# Patient Record
Sex: Female | Born: 1995
Health system: Southern US, Community
[De-identification: ages and names within clinical notes are randomized; demographics above are authoritative.]

## PROBLEM LIST (undated history)

## (undated) DIAGNOSIS — Z789 Other specified health status: Secondary | ICD-10-CM

---

## 2019-01-24 DIAGNOSIS — R569 Unspecified convulsions: Secondary | ICD-10-CM

## 2019-01-24 HISTORY — DX: Unspecified convulsions: R56.9

## 2019-01-29 ENCOUNTER — Emergency Department (HOSPITAL_COMMUNITY): Payer: No Typology Code available for payment source

## 2019-01-29 ENCOUNTER — Encounter (HOSPITAL_COMMUNITY): Payer: Self-pay | Admitting: Emergency Medicine

## 2019-01-29 ENCOUNTER — Inpatient Hospital Stay (HOSPITAL_COMMUNITY): Payer: No Typology Code available for payment source | Admitting: Certified Registered Nurse Anesthetist

## 2019-01-29 ENCOUNTER — Other Ambulatory Visit: Payer: Self-pay

## 2019-01-29 ENCOUNTER — Inpatient Hospital Stay (HOSPITAL_COMMUNITY)
Admission: EM | Admit: 2019-01-29 | Discharge: 2019-02-10 | DRG: 955 | Disposition: A | Discharge status: Rehabilitation Facility | Payer: No Typology Code available for payment source | Attending: Student | Admitting: Student

## 2019-01-29 ENCOUNTER — Inpatient Hospital Stay (HOSPITAL_COMMUNITY): Payer: No Typology Code available for payment source

## 2019-01-29 ENCOUNTER — Encounter (HOSPITAL_COMMUNITY): Admission: EM | Disposition: A | Discharge status: Rehabilitation Facility | Payer: Self-pay | Source: Home / Self Care

## 2019-01-29 DIAGNOSIS — Z4659 Encounter for fitting and adjustment of other gastrointestinal appliance and device: Secondary | ICD-10-CM

## 2019-01-29 DIAGNOSIS — S42033B Displaced fracture of lateral end of unspecified clavicle, initial encounter for open fracture: Secondary | ICD-10-CM

## 2019-01-29 DIAGNOSIS — J9601 Acute respiratory failure with hypoxia: Secondary | ICD-10-CM | POA: Diagnosis present

## 2019-01-29 DIAGNOSIS — S72461C Displaced supracondylar fracture with intracondylar extension of lower end of right femur, initial encounter for open fracture type IIIA, IIIB, or IIIC: Secondary | ICD-10-CM | POA: Diagnosis present

## 2019-01-29 DIAGNOSIS — G40401 Other generalized epilepsy and epileptic syndromes, not intractable, with status epilepticus: Secondary | ICD-10-CM | POA: Diagnosis present

## 2019-01-29 DIAGNOSIS — S81811A Laceration without foreign body, right lower leg, initial encounter: Secondary | ICD-10-CM

## 2019-01-29 DIAGNOSIS — T4275XA Adverse effect of unspecified antiepileptic and sedative-hypnotic drugs, initial encounter: Secondary | ICD-10-CM | POA: Diagnosis not present

## 2019-01-29 DIAGNOSIS — G92 Toxic encephalopathy: Secondary | ICD-10-CM | POA: Diagnosis not present

## 2019-01-29 DIAGNOSIS — S42034B Nondisplaced fracture of lateral end of right clavicle, initial encounter for open fracture: Secondary | ICD-10-CM | POA: Diagnosis present

## 2019-01-29 DIAGNOSIS — S76121A Laceration of right quadriceps muscle, fascia and tendon, initial encounter: Secondary | ICD-10-CM | POA: Diagnosis present

## 2019-01-29 DIAGNOSIS — S0101XA Laceration without foreign body of scalp, initial encounter: Secondary | ICD-10-CM | POA: Diagnosis present

## 2019-01-29 DIAGNOSIS — G936 Cerebral edema: Secondary | ICD-10-CM | POA: Diagnosis present

## 2019-01-29 DIAGNOSIS — S42031B Displaced fracture of lateral end of right clavicle, initial encounter for open fracture: Secondary | ICD-10-CM | POA: Diagnosis present

## 2019-01-29 DIAGNOSIS — E222 Syndrome of inappropriate secretion of antidiuretic hormone: Secondary | ICD-10-CM | POA: Diagnosis not present

## 2019-01-29 DIAGNOSIS — D62 Acute posthemorrhagic anemia: Secondary | ICD-10-CM | POA: Diagnosis not present

## 2019-01-29 DIAGNOSIS — S36031A Moderate laceration of spleen, initial encounter: Secondary | ICD-10-CM | POA: Diagnosis present

## 2019-01-29 DIAGNOSIS — S065X9A Traumatic subdural hemorrhage with loss of consciousness of unspecified duration, initial encounter: Secondary | ICD-10-CM | POA: Diagnosis not present

## 2019-01-29 DIAGNOSIS — S2231XA Fracture of one rib, right side, initial encounter for closed fracture: Secondary | ICD-10-CM | POA: Diagnosis present

## 2019-01-29 DIAGNOSIS — Y92410 Unspecified street and highway as the place of occurrence of the external cause: Secondary | ICD-10-CM | POA: Diagnosis not present

## 2019-01-29 DIAGNOSIS — S82001A Unspecified fracture of right patella, initial encounter for closed fracture: Secondary | ICD-10-CM | POA: Diagnosis present

## 2019-01-29 DIAGNOSIS — S82142A Displaced bicondylar fracture of left tibia, initial encounter for closed fracture: Secondary | ICD-10-CM

## 2019-01-29 DIAGNOSIS — S42036A Nondisplaced fracture of lateral end of unspecified clavicle, initial encounter for closed fracture: Secondary | ICD-10-CM

## 2019-01-29 DIAGNOSIS — S0181XA Laceration without foreign body of other part of head, initial encounter: Secondary | ICD-10-CM | POA: Diagnosis present

## 2019-01-29 DIAGNOSIS — S065X9D Traumatic subdural hemorrhage with loss of consciousness of unspecified duration, subsequent encounter: Secondary | ICD-10-CM | POA: Diagnosis not present

## 2019-01-29 DIAGNOSIS — S065XAA Traumatic subdural hemorrhage with loss of consciousness status unknown, initial encounter: Secondary | ICD-10-CM

## 2019-01-29 DIAGNOSIS — S61411A Laceration without foreign body of right hand, initial encounter: Secondary | ICD-10-CM | POA: Diagnosis present

## 2019-01-29 DIAGNOSIS — Z20822 Contact with and (suspected) exposure to covid-19: Secondary | ICD-10-CM | POA: Diagnosis present

## 2019-01-29 DIAGNOSIS — S82044A Nondisplaced comminuted fracture of right patella, initial encounter for closed fracture: Secondary | ICD-10-CM

## 2019-01-29 DIAGNOSIS — S76111A Strain of right quadriceps muscle, fascia and tendon, initial encounter: Secondary | ICD-10-CM

## 2019-01-29 DIAGNOSIS — Z419 Encounter for procedure for purposes other than remedying health state, unspecified: Secondary | ICD-10-CM

## 2019-01-29 DIAGNOSIS — T1490XA Injury, unspecified, initial encounter: Secondary | ICD-10-CM

## 2019-01-29 DIAGNOSIS — J9690 Respiratory failure, unspecified, unspecified whether with hypoxia or hypercapnia: Secondary | ICD-10-CM

## 2019-01-29 DIAGNOSIS — S42292A Other displaced fracture of upper end of left humerus, initial encounter for closed fracture: Secondary | ICD-10-CM | POA: Diagnosis present

## 2019-01-29 DIAGNOSIS — R40241 Glasgow coma scale score 13-15, unspecified time: Secondary | ICD-10-CM | POA: Diagnosis present

## 2019-01-29 DIAGNOSIS — E861 Hypovolemia: Secondary | ICD-10-CM | POA: Diagnosis present

## 2019-01-29 DIAGNOSIS — Z978 Presence of other specified devices: Secondary | ICD-10-CM

## 2019-01-29 DIAGNOSIS — S20212A Contusion of left front wall of thorax, initial encounter: Secondary | ICD-10-CM | POA: Diagnosis present

## 2019-01-29 DIAGNOSIS — S72463C Displaced supracondylar fracture with intracondylar extension of lower end of unspecified femur, initial encounter for open fracture type IIIA, IIIB, or IIIC: Secondary | ICD-10-CM | POA: Insufficient documentation

## 2019-01-29 DIAGNOSIS — Z9289 Personal history of other medical treatment: Secondary | ICD-10-CM

## 2019-01-29 DIAGNOSIS — T148XXA Other injury of unspecified body region, initial encounter: Secondary | ICD-10-CM

## 2019-01-29 DIAGNOSIS — E876 Hypokalemia: Secondary | ICD-10-CM | POA: Diagnosis present

## 2019-01-29 DIAGNOSIS — S36039A Unspecified laceration of spleen, initial encounter: Secondary | ICD-10-CM

## 2019-01-29 DIAGNOSIS — S42202A Unspecified fracture of upper end of left humerus, initial encounter for closed fracture: Secondary | ICD-10-CM

## 2019-01-29 DIAGNOSIS — S7291XC Unspecified fracture of right femur, initial encounter for open fracture type IIIA, IIIB, or IIIC: Secondary | ICD-10-CM

## 2019-01-29 HISTORY — PX: CRANIOTOMY: SHX93

## 2019-01-29 HISTORY — DX: Other specified health status: Z78.9

## 2019-01-29 LAB — COMPREHENSIVE METABOLIC PANEL
ALT: 67 U/L — ABNORMAL HIGH (ref 0–44)
AST: 371 U/L — ABNORMAL HIGH (ref 15–41)
Albumin: 2.7 g/dL — ABNORMAL LOW (ref 3.5–5.0)
Alkaline Phosphatase: 73 U/L (ref 38–126)
Anion gap: 11 (ref 5–15)
BUN: 7 mg/dL (ref 6–20)
CO2: 23 mmol/L (ref 22–32)
Calcium: 7.8 mg/dL — ABNORMAL LOW (ref 8.9–10.3)
Chloride: 103 mmol/L (ref 98–111)
Creatinine, Ser: 0.91 mg/dL (ref 0.44–1.00)
GFR calc Af Amer: >60 mL/min (ref >60)
GFR calc non Af Amer: >60 mL/min (ref >60)
Glucose, Bld: 183 mg/dL — ABNORMAL HIGH (ref 70–99)
Potassium: 3.4 mmol/L — ABNORMAL LOW (ref 3.5–5.1)
Sodium: 137 mmol/L (ref 135–145)
Total Bilirubin: 0.7 mg/dL (ref 0.3–1.2)
Total Protein: 5.9 g/dL — ABNORMAL LOW (ref 6.5–8.1)

## 2019-01-29 LAB — I-STAT CHEM 8, ED
BUN: 7 mg/dL (ref 6–20)
Calcium, Ion: 1.02 mmol/L — ABNORMAL LOW (ref 1.15–1.40)
Chloride: 101 mmol/L (ref 98–111)
Creatinine, Ser: 0.9 mg/dL (ref 0.44–1.00)
Glucose, Bld: 175 mg/dL — ABNORMAL HIGH (ref 70–99)
HCT: 32 % — ABNORMAL LOW (ref 36.0–46.0)
Hemoglobin: 10.9 g/dL — ABNORMAL LOW (ref 12.0–15.0)
Potassium: 3.2 mmol/L — ABNORMAL LOW (ref 3.5–5.1)
Sodium: 138 mmol/L (ref 135–145)
TCO2: 25 mmol/L (ref 22–32)

## 2019-01-29 LAB — PROTIME-INR
INR: 1.2 (ref 0.8–1.2)
Prothrombin Time: 15.1 s (ref 11.4–15.2)

## 2019-01-29 LAB — POCT I-STAT 7, (LYTES, BLD GAS, ICA,H+H)
Bicarbonate: 24.8 mmol/L (ref 20.0–28.0)
Calcium, Ion: 1.12 mmol/L — ABNORMAL LOW (ref 1.15–1.40)
HCT: 29 % — ABNORMAL LOW (ref 36.0–46.0)
Hemoglobin: 9.9 g/dL — ABNORMAL LOW (ref 12.0–15.0)
O2 Saturation: 100 %
Patient temperature: 96.9
Potassium: 2.9 mmol/L — ABNORMAL LOW (ref 3.5–5.1)
Sodium: 138 mmol/L (ref 135–145)
TCO2: 26 mmol/L (ref 22–32)
pCO2 arterial: 38.1 mmHg (ref 32.0–48.0)
pH, Arterial: 7.418 (ref 7.350–7.450)
pO2, Arterial: 181 mmHg — ABNORMAL HIGH (ref 83.0–108.0)

## 2019-01-29 LAB — URINALYSIS, ROUTINE W REFLEX MICROSCOPIC
Bilirubin Urine: NEGATIVE
Glucose, UA: NEGATIVE mg/dL
Ketones, ur: NEGATIVE mg/dL
Leukocytes,Ua: NEGATIVE
Nitrite: NEGATIVE
Protein, ur: 100 mg/dL — AB
Specific Gravity, Urine: 1.032 — ABNORMAL HIGH (ref 1.005–1.030)
pH: 6 (ref 5.0–8.0)

## 2019-01-29 LAB — CBC
HCT: 33.4 % — ABNORMAL LOW (ref 36.0–46.0)
Hemoglobin: 10.7 g/dL — ABNORMAL LOW (ref 12.0–15.0)
MCH: 28.2 pg (ref 26.0–34.0)
MCHC: 32 g/dL (ref 30.0–36.0)
MCV: 88.1 fL (ref 80.0–100.0)
Platelets: 342 10*3/uL (ref 150–400)
RBC: 3.79 MIL/uL — ABNORMAL LOW (ref 3.87–5.11)
RDW: 13.4 % (ref 11.5–15.5)
WBC: 20.3 10*3/uL — ABNORMAL HIGH (ref 4.0–10.5)
nRBC: 0.1 % (ref 0.0–0.2)

## 2019-01-29 LAB — RAPID URINE DRUG SCREEN, HOSP PERFORMED
Amphetamines: NOT DETECTED
Barbiturates: NOT DETECTED
Benzodiazepines: POSITIVE — AB
Cocaine: POSITIVE — AB
Opiates: POSITIVE — AB
Tetrahydrocannabinol: POSITIVE — AB

## 2019-01-29 LAB — ABO/RH: ABO/RH(D): O POS

## 2019-01-29 LAB — LACTIC ACID, PLASMA: Lactic Acid, Venous: 2.7 mmol/L (ref 0.5–1.9)

## 2019-01-29 LAB — RESPIRATORY PANEL BY RT PCR (FLU A&B, COVID)
Influenza A by PCR: NEGATIVE
Influenza B by PCR: NEGATIVE
SARS Coronavirus 2 by RT PCR: NEGATIVE

## 2019-01-29 LAB — ETHANOL: Alcohol, Ethyl (B): <10 mg/dL (ref <10)

## 2019-01-29 LAB — SAMPLE TO BLOOD BANK

## 2019-01-29 LAB — PREGNANCY, URINE: Preg Test, Ur: NEGATIVE

## 2019-01-29 SURGERY — CRANIOTOMY HEMATOMA EVACUATION SUBDURAL
Anesthesia: General | Site: Head | Laterality: Left

## 2019-01-29 MED ORDER — LIDOCAINE-EPINEPHRINE 1 %-1:100000 IJ SOLN
INTRAMUSCULAR | Status: AC
  Filled 2019-01-29: qty 1

## 2019-01-29 MED ORDER — ROCURONIUM BROMIDE 100 MG/10ML IV SOLN
INTRAVENOUS | Status: DC | PRN
  Administered 2019-01-29: 100 mg via INTRAVENOUS
  Administered 2019-01-29 (×2): 50 mg via INTRAVENOUS

## 2019-01-29 MED ORDER — PROPOFOL 1000 MG/100ML IV EMUL
5.0000 ug/kg/min | INTRAVENOUS | Status: DC
  Administered 2019-01-29: 5 ug/kg/min via INTRAVENOUS

## 2019-01-29 MED ORDER — POTASSIUM CHLORIDE 10 MEQ/100ML IV SOLN
10.0000 meq | INTRAVENOUS | Status: AC
  Administered 2019-01-30: 10 meq via INTRAVENOUS
  Filled 2019-01-29: qty 100

## 2019-01-29 MED ORDER — MANNITOL 25 % IV SOLN
25.0000 g | Freq: Once | Status: AC
  Administered 2019-01-29: 25 g via INTRAVENOUS
  Filled 2019-01-29: qty 100

## 2019-01-29 MED ORDER — ONDANSETRON HCL 4 MG/2ML IJ SOLN
4.0000 mg | Freq: Four times a day (QID) | INTRAMUSCULAR | Status: DC | PRN
  Administered 2019-02-03: 4 mg via INTRAVENOUS

## 2019-01-29 MED ORDER — LIDOCAINE-EPINEPHRINE 1 %-1:100000 IJ SOLN
INTRAMUSCULAR | Status: DC | PRN
  Administered 2019-01-29: 30 mL

## 2019-01-29 MED ORDER — THROMBIN 20000 UNITS EX SOLR
CUTANEOUS | Status: AC
  Filled 2019-01-29: qty 20000

## 2019-01-29 MED ORDER — CEFAZOLIN SODIUM-DEXTROSE 2-3 GM-%(50ML) IV SOLR
INTRAVENOUS | Status: DC | PRN
  Administered 2019-01-29: 2 g via INTRAVENOUS

## 2019-01-29 MED ORDER — PHENYLEPHRINE HCL (PRESSORS) 10 MG/ML IV SOLN
INTRAVENOUS | Status: DC | PRN
  Administered 2019-01-29: 80 ug via INTRAVENOUS

## 2019-01-29 MED ORDER — SODIUM CHLORIDE 0.9 % IV SOLN: INTRAVENOUS | Status: DC | PRN

## 2019-01-29 MED ORDER — PROPOFOL 10 MG/ML IV BOLUS
INTRAVENOUS | Status: AC
  Filled 2019-01-29: qty 40

## 2019-01-29 MED ORDER — 0.9 % SODIUM CHLORIDE (POUR BTL) OPTIME
TOPICAL | Status: DC | PRN
  Administered 2019-01-29 (×4): 1000 mL

## 2019-01-29 MED ORDER — CHLORHEXIDINE GLUCONATE 0.12% ORAL RINSE (MEDLINE KIT)
15.0000 mL | Freq: Two times a day (BID) | OROMUCOSAL | Status: DC
  Administered 2019-01-30 – 2019-02-01 (×6): 15 mL via OROMUCOSAL

## 2019-01-29 MED ORDER — PROPOFOL 1000 MG/100ML IV EMUL
0.0000 ug/kg/min | INTRAVENOUS | Status: DC
  Administered 2019-01-30 (×3): 40 ug/kg/min via INTRAVENOUS
  Administered 2019-01-30: 45 ug/kg/min via INTRAVENOUS
  Administered 2019-01-31: 40 ug/kg/min via INTRAVENOUS
  Administered 2019-01-31: 07:00:00 50 ug/kg/min via INTRAVENOUS
  Filled 2019-01-29 (×5): qty 100

## 2019-01-29 MED ORDER — FENTANYL CITRATE (PF) 250 MCG/5ML IJ SOLN
INTRAMUSCULAR | Status: DC | PRN
  Administered 2019-01-29 (×3): 50 ug via INTRAVENOUS
  Administered 2019-01-29: 100 ug via INTRAVENOUS

## 2019-01-29 MED ORDER — THROMBIN 5000 UNITS EX SOLR
CUTANEOUS | Status: AC
  Filled 2019-01-29: qty 5000

## 2019-01-29 MED ORDER — ALBUMIN HUMAN 5 % IV SOLN: INTRAVENOUS | Status: DC | PRN

## 2019-01-29 MED ORDER — HEMOSTATIC AGENTS (NO CHARGE) OPTIME
TOPICAL | Status: DC | PRN
  Administered 2019-01-29: 1 via TOPICAL

## 2019-01-29 MED ORDER — CEFAZOLIN SODIUM-DEXTROSE 2-4 GM/100ML-% IV SOLN
2.0000 g | Freq: Once | INTRAVENOUS | Status: AC
  Administered 2019-01-29: 2 g via INTRAVENOUS

## 2019-01-29 MED ORDER — FENTANYL CITRATE (PF) 250 MCG/5ML IJ SOLN
INTRAMUSCULAR | Status: AC
  Filled 2019-01-29: qty 5

## 2019-01-29 MED ORDER — ROCURONIUM BROMIDE 10 MG/ML (PF) SYRINGE
PREFILLED_SYRINGE | INTRAVENOUS | Status: AC
  Filled 2019-01-29: qty 10

## 2019-01-29 MED ORDER — ONDANSETRON 4 MG PO TBDP: 4.0000 mg | ORAL_TABLET | Freq: Four times a day (QID) | ORAL | Status: DC | PRN

## 2019-01-29 MED ORDER — ROCURONIUM BROMIDE 10 MG/ML (PF) SYRINGE
PREFILLED_SYRINGE | INTRAVENOUS | Status: AC
  Filled 2019-01-29: qty 20

## 2019-01-29 MED ORDER — SODIUM CHLORIDE 0.9 % IV SOLN: INTRAVENOUS | Status: DC

## 2019-01-29 MED ORDER — PROPOFOL 1000 MG/100ML IV EMUL
INTRAVENOUS | Status: AC
  Filled 2019-01-29: qty 100

## 2019-01-29 MED ORDER — FENTANYL CITRATE (PF) 100 MCG/2ML IJ SOLN
50.0000 ug | INTRAMUSCULAR | Status: DC | PRN
  Administered 2019-01-30: 09:00:00 50 ug via INTRAVENOUS
  Filled 2019-01-29: qty 2

## 2019-01-29 MED ORDER — LEVETIRACETAM IN NACL 1000 MG/100ML IV SOLN
1000.0000 mg | Freq: Two times a day (BID) | INTRAVENOUS | Status: DC
  Administered 2019-01-30 – 2019-02-02 (×8): 1000 mg via INTRAVENOUS
  Filled 2019-01-29 (×8): qty 100

## 2019-01-29 MED ORDER — LIDOCAINE 2% (20 MG/ML) 5 ML SYRINGE
INTRAMUSCULAR | Status: AC
  Filled 2019-01-29: qty 5

## 2019-01-29 MED ORDER — BISACODYL 10 MG RE SUPP: 10.0000 mg | Freq: Every day | RECTAL | Status: DC | PRN

## 2019-01-29 MED ORDER — FENTANYL CITRATE (PF) 100 MCG/2ML IJ SOLN
50.0000 ug | INTRAMUSCULAR | Status: DC | PRN
  Administered 2019-01-30 – 2019-01-31 (×6): 100 ug via INTRAVENOUS
  Administered 2019-01-31 (×2): 200 ug via INTRAVENOUS
  Administered 2019-01-31: 100 ug via INTRAVENOUS
  Filled 2019-01-29 (×2): qty 2
  Filled 2019-01-29: qty 4
  Filled 2019-01-29: qty 2
  Filled 2019-01-29: qty 4
  Filled 2019-01-29 (×4): qty 2

## 2019-01-29 MED ORDER — ETOMIDATE 2 MG/ML IV SOLN
INTRAVENOUS | Status: AC | PRN
  Administered 2019-01-29: 20 mg via INTRAVENOUS

## 2019-01-29 MED ORDER — IOHEXOL 300 MG/ML  SOLN
100.0000 mL | Freq: Once | INTRAMUSCULAR | Status: AC | PRN
  Administered 2019-01-29: 19:00:00 100 mL via INTRAVENOUS

## 2019-01-29 MED ORDER — THROMBIN 5000 UNITS EX SOLR: OROMUCOSAL | Status: DC | PRN

## 2019-01-29 MED ORDER — ORAL CARE MOUTH RINSE
15.0000 mL | OROMUCOSAL | Status: DC
  Administered 2019-01-30 – 2019-02-01 (×22): 15 mL via OROMUCOSAL

## 2019-01-29 MED ORDER — CEFAZOLIN SODIUM 1 G IJ SOLR
INTRAMUSCULAR | Status: AC
  Filled 2019-01-29: qty 20

## 2019-01-29 MED ORDER — PHENYLEPHRINE 40 MCG/ML (10ML) SYRINGE FOR IV PUSH (FOR BLOOD PRESSURE SUPPORT)
PREFILLED_SYRINGE | INTRAVENOUS | Status: AC
  Filled 2019-01-29: qty 20

## 2019-01-29 MED ORDER — LEVETIRACETAM IN NACL 1000 MG/100ML IV SOLN
1000.0000 mg | Freq: Once | INTRAVENOUS | Status: AC
  Administered 2019-01-29: 1000 mg via INTRAVENOUS
  Filled 2019-01-29: qty 100

## 2019-01-29 MED ORDER — BACITRACIN ZINC 500 UNIT/GM EX OINT
TOPICAL_OINTMENT | CUTANEOUS | Status: AC
  Filled 2019-01-29: qty 28.35

## 2019-01-29 MED ORDER — SODIUM CHLORIDE 0.9 % IV SOLN
2.0000 g | INTRAVENOUS | Status: DC
  Administered 2019-01-30: 2 g via INTRAVENOUS
  Filled 2019-01-29: qty 2

## 2019-01-29 MED ORDER — ROCURONIUM BROMIDE 50 MG/5ML IV SOLN
INTRAVENOUS | Status: AC | PRN
  Administered 2019-01-29: 70 mg via INTRAVENOUS

## 2019-01-29 MED ORDER — THROMBIN 20000 UNITS EX SOLR: CUTANEOUS | Status: DC | PRN

## 2019-01-29 SURGICAL SUPPLY — 66 items
BLADE CLIPPER SURG (BLADE) ×2 IMPLANT
BUR ACORN 6.0 PRECISION (BURR) ×2 IMPLANT
BUR MATCHSTICK NEURO 3.0 LAGG (BURR) ×1 IMPLANT
BUR SPIRAL ROUTER 2.3 (BUR) ×1 IMPLANT
CANISTER SUCT 3000ML PPV (MISCELLANEOUS) ×2 IMPLANT
CARTRIDGE OIL MAESTRO DRILL (MISCELLANEOUS) ×1 IMPLANT
CLIP VESOCCLUDE MED 6/CT (CLIP) IMPLANT
DIFFUSER DRILL AIR PNEUMATIC (MISCELLANEOUS) ×2 IMPLANT
DRAPE NEUROLOGICAL W/INCISE (DRAPES) ×2 IMPLANT
DRAPE SURG 17X23 STRL (DRAPES) IMPLANT
DRAPE WARM FLUID 44X44 (DRAPES) ×2 IMPLANT
DRSG MEPILEX BORDER 4X8 (GAUZE/BANDAGES/DRESSINGS) ×2 IMPLANT
DRSG TEGADERM 2-3/8X2-3/4 SM (GAUZE/BANDAGES/DRESSINGS) ×1 IMPLANT
DURAGUARD 06CMX08CM ×2 IMPLANT
DURAPREP 6ML APPLICATOR 50/CS (WOUND CARE) ×2 IMPLANT
ELECT REM PT RETURN 9FT ADLT (ELECTROSURGICAL) ×2
ELECTRODE REM PT RTRN 9FT ADLT (ELECTROSURGICAL) ×1 IMPLANT
EVACUATOR 1/8 PVC DRAIN (DRAIN) IMPLANT
GAUZE 4X4 16PLY RFD (DISPOSABLE) IMPLANT
GAUZE SPONGE 4X4 12PLY STRL (GAUZE/BANDAGES/DRESSINGS) ×2 IMPLANT
GLOVE BIOGEL PI IND STRL 7.5 (GLOVE) ×2 IMPLANT
GLOVE BIOGEL PI INDICATOR 7.5 (GLOVE) ×2
GLOVE ECLIPSE 7.5 STRL STRAW (GLOVE) ×4 IMPLANT
GOWN STRL REUS W/ TWL LRG LVL3 (GOWN DISPOSABLE) ×2 IMPLANT
GOWN STRL REUS W/ TWL XL LVL3 (GOWN DISPOSABLE) IMPLANT
GOWN STRL REUS W/TWL 2XL LVL3 (GOWN DISPOSABLE) IMPLANT
GOWN STRL REUS W/TWL LRG LVL3 (GOWN DISPOSABLE) ×2
GOWN STRL REUS W/TWL XL LVL3 (GOWN DISPOSABLE) ×2
GRAFT DURAGEN MATRIX 5WX7L (Graft) ×1 IMPLANT
HEMOSTAT POWDER KIT SURGIFOAM (HEMOSTASIS) ×2 IMPLANT
HEMOSTAT SURGICEL 2X14 (HEMOSTASIS) ×1 IMPLANT
KIT BASIN OR (CUSTOM PROCEDURE TRAY) ×2 IMPLANT
KIT TURNOVER KIT B (KITS) ×2 IMPLANT
NEEDLE HYPO 22GX1.5 SAFETY (NEEDLE) ×2 IMPLANT
NS IRRIG 1000ML POUR BTL (IV SOLUTION) ×2 IMPLANT
OIL CARTRIDGE MAESTRO DRILL (MISCELLANEOUS) ×2
PACK CRANIOTOMY CUSTOM (CUSTOM PROCEDURE TRAY) ×2 IMPLANT
PATTIES SURGICAL .5 X.5 (GAUZE/BANDAGES/DRESSINGS) IMPLANT
PATTIES SURGICAL .5 X3 (DISPOSABLE) IMPLANT
PATTIES SURGICAL 1X1 (DISPOSABLE) IMPLANT
PERFORATOR LRG  14-11MM (BIT) ×1
PERFORATOR LRG 14-11MM (BIT) IMPLANT
PLATE 1.5  2HOLE LNG NEURO (Plate) ×1 IMPLANT
PLATE 1.5 2HOLE LNG NEURO (Plate) IMPLANT
PLATE 1.5/0.5 18.5MM BURR HOLE (Plate) ×3 IMPLANT
SCREW SELF DRILL HT 1.5/4MM (Screw) ×7 IMPLANT
SEPRAFILM MEMBRANE 5X6 (MISCELLANEOUS) ×1 IMPLANT
SPONGE NEURO XRAY DETECT 1X3 (DISPOSABLE) IMPLANT
SPONGE SURGIFOAM ABS GEL 100 (HEMOSTASIS) ×2 IMPLANT
STAPLER VISISTAT 35W (STAPLE) ×4 IMPLANT
STOCKINETTE 6  STRL (DRAPES) ×1
STOCKINETTE 6 STRL (DRAPES) ×1 IMPLANT
SUT ETHILON 3 0 FSL (SUTURE) ×1 IMPLANT
SUT ETHILON 3 0 PS 1 (SUTURE) ×1 IMPLANT
SUT MNCRL 3 0 VIOLET RB1 (SUTURE) IMPLANT
SUT MONOCRYL 3 0 RB1 (SUTURE) ×1
SUT NURALON 4 0 TR CR/8 (SUTURE) ×5 IMPLANT
SUT VIC AB 0 CT1 18XCR BRD8 (SUTURE) ×2 IMPLANT
SUT VIC AB 0 CT1 8-18 (SUTURE) ×2
SUT VIC AB 3-0 SH 8-18 (SUTURE) ×4 IMPLANT
TAPE CLOTH 1X10 TAN NS (GAUZE/BANDAGES/DRESSINGS) ×2 IMPLANT
TAPE PAPER 3X10 WHT MICROPORE (GAUZE/BANDAGES/DRESSINGS) ×1 IMPLANT
TOWEL GREEN STERILE (TOWEL DISPOSABLE) ×2 IMPLANT
TOWEL GREEN STERILE FF (TOWEL DISPOSABLE) ×2 IMPLANT
TUBE CONNECTING 12X1/4 (SUCTIONS) ×2 IMPLANT
WATER STERILE IRR 1000ML POUR (IV SOLUTION) ×2 IMPLANT
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
## 2019-01-29 NOTE — Brief Op Note (Signed)
01/29/2019  11:38 PM  PATIENT:  Susan Wilkinson  24 y.o. female  PRE-OPERATIVE DIAGNOSIS:  Subdural Hematoma  POST-OPERATIVE DIAGNOSIS:  Subdural Hematoma  PROCEDURE:  Procedure(s): Left Hemi Craniectomy for Subdural Hematoma (Left)  SURGEON:  Surgeon(s) and Role:    Maisie Fus, Coy Saunas, MD - Primary  PHYSICIAN ASSISTANT:    ASSISTANTS:    ANESTHESIA:   general  EBL:  200 mL   BLOOD ADMINISTERED: 4 units PRBCs  DRAINS: (1) Hemovact drain(s) in the subgaleal space with  Suction Open   LOCAL MEDICATIONS USED:  LIDOCAINE   SPECIMEN:  No Specimen  DISPOSITION OF SPECIMEN:  N/A  COUNTS:  YES  TOURNIQUET:  * No tourniquets in log *  DICTATION: .Dragon Dictation  PLAN OF CARE: Admit to inpatient   PATIENT DISPOSITION:  ICU - intubated and hemodynamically stable.   Delay start of Pharmacological VTE agent (>24hrs) due to surgical blood loss or risk of bleeding: yes

## 2019-01-29 NOTE — Anesthesia Preprocedure Evaluation (Addendum)
Anesthesia Evaluation  Patient identified by MRN, date of birth, ID band Patient unresponsive  Preop documentation limited or incomplete due to emergent nature of procedure.  Airway Mallampati: Intubated       Dental   Pulmonary           Cardiovascular      Neuro/Psych    GI/Hepatic   Endo/Other    Renal/GU      Musculoskeletal   Abdominal   Peds  Hematology   Anesthesia Other Findings   Reproductive/Obstetrics                            Anesthesia Physical Anesthesia Plan  ASA: III and emergent  Anesthesia Plan: General   Post-op Pain Management:    Induction: Intravenous  PONV Risk Score and Plan: 3 and Treatment may vary due to age or medical condition  Airway Management Planned: Oral ETT  Additional Equipment:   Intra-op Plan:   Post-operative Plan: Post-operative intubation/ventilation  Informed Consent: I have reviewed the patients History and Physical, chart, labs and discussed the procedure including the risks, benefits and alternatives for the proposed anesthesia with the patient or authorized representative who has indicated his/her understanding and acceptance.     History available from chart only and Only emergency history available  Plan Discussed with: Anesthesiologist, CRNA and Surgeon  Anesthesia Plan Comments:        Anesthesia Quick Evaluation
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
## 2019-01-29 NOTE — ED Notes (Signed)
Pt transported to OR

## 2019-01-29 NOTE — ED Notes (Signed)
Patient arrival to ED Trauma A. MD Rubin Payor and MD Dwain Sarna at bedside
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
## 2019-01-29 NOTE — Progress Notes (Signed)
Patient transported from ED Trauma A to OR without complication. Patient bagged into OR. RT took ventilator to 4N and gave report to unit RT.
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
## 2019-01-29 NOTE — OR Nursing (Deleted)
All medications administered during surgery by Dr Andres Labrum

## 2019-01-29 NOTE — Anesthesia Procedure Notes (Signed)
Arterial Line Insertion Start/End1/06/2019 10:15 PM Performed by: Mayer Camel, CRNA, CRNA  Patient location: OR. Preanesthetic checklist: patient identified, IV checked, site marked, risks and benefits discussed, surgical consent, monitors and equipment checked, pre-op evaluation, timeout performed and anesthesia consent Right, radial was placed Catheter size: 20 G Hand hygiene performed  and maximum sterile barriers used   Attempts: 1 Procedure performed without using ultrasound guided technique. Following insertion, dressing applied and Biopatch. Post procedure assessment: normal and unchanged  Patient tolerated the procedure well with no immediate complications.

## 2019-01-30 ENCOUNTER — Inpatient Hospital Stay (HOSPITAL_COMMUNITY): Payer: No Typology Code available for payment source | Admitting: Certified Registered Nurse Anesthetist

## 2019-01-30 ENCOUNTER — Encounter: Payer: Self-pay | Admitting: *Deleted

## 2019-01-30 ENCOUNTER — Encounter (HOSPITAL_COMMUNITY): Admission: EM | Disposition: A | Discharge status: Rehabilitation Facility | Payer: Self-pay | Source: Home / Self Care

## 2019-01-30 ENCOUNTER — Inpatient Hospital Stay (HOSPITAL_COMMUNITY): Payer: No Typology Code available for payment source

## 2019-01-30 HISTORY — PX: EXTERNAL FIXATION LEG: SHX1549

## 2019-01-30 HISTORY — PX: I & D EXTREMITY: SHX5045

## 2019-01-30 HISTORY — PX: INCISION AND DRAINAGE OF WOUND: SHX1803

## 2019-01-30 LAB — BASIC METABOLIC PANEL
Anion gap: 9 (ref 5–15)
BUN: 8 mg/dL (ref 6–20)
CO2: 19 mmol/L — ABNORMAL LOW (ref 22–32)
Calcium: 6.9 mg/dL — ABNORMAL LOW (ref 8.9–10.3)
Chloride: 111 mmol/L (ref 98–111)
Creatinine, Ser: 0.62 mg/dL (ref 0.44–1.00)
GFR calc Af Amer: >60 mL/min (ref >60)
GFR calc non Af Amer: >60 mL/min (ref >60)
Glucose, Bld: 98 mg/dL (ref 70–99)
Potassium: 3.1 mmol/L — ABNORMAL LOW (ref 3.5–5.1)
Sodium: 139 mmol/L (ref 135–145)

## 2019-01-30 LAB — POCT I-STAT 7, (LYTES, BLD GAS, ICA,H+H)
Acid-base deficit: 5 mmol/L — ABNORMAL HIGH (ref 0.0–2.0)
Acid-base deficit: 6 mmol/L — ABNORMAL HIGH (ref 0.0–2.0)
Bicarbonate: 20.6 mmol/L (ref 20.0–28.0)
Bicarbonate: 19.9 mmol/L — ABNORMAL LOW (ref 20.0–28.0)
Calcium, Ion: 1.1 mmol/L — ABNORMAL LOW (ref 1.15–1.40)
Calcium, Ion: 1.13 mmol/L — ABNORMAL LOW (ref 1.15–1.40)
HCT: 36 % (ref 36.0–46.0)
HCT: 24 % — ABNORMAL LOW (ref 36.0–46.0)
Hemoglobin: 12.2 g/dL (ref 12.0–15.0)
Hemoglobin: 8.2 g/dL — ABNORMAL LOW (ref 12.0–15.0)
O2 Saturation: 100 %
O2 Saturation: 100 %
Patient temperature: 97.3
Patient temperature: 36
Potassium: 3.7 mmol/L (ref 3.5–5.1)
Potassium: 3.8 mmol/L (ref 3.5–5.1)
Sodium: 143 mmol/L (ref 135–145)
Sodium: 142 mmol/L (ref 135–145)
TCO2: 22 mmol/L (ref 22–32)
TCO2: 21 mmol/L — ABNORMAL LOW (ref 22–32)
pCO2 arterial: 36.4 mmHg (ref 32.0–48.0)
pCO2 arterial: 38.4 mmHg (ref 32.0–48.0)
pH, Arterial: 7.358 (ref 7.350–7.450)
pH, Arterial: 7.316 — ABNORMAL LOW (ref 7.350–7.450)
pO2, Arterial: 189 mmHg — ABNORMAL HIGH (ref 83.0–108.0)
pO2, Arterial: 272 mmHg — ABNORMAL HIGH (ref 83.0–108.0)

## 2019-01-30 LAB — CBC
HCT: 38.3 % (ref 36.0–46.0)
Hemoglobin: 13.1 g/dL (ref 12.0–15.0)
MCH: 29.6 pg (ref 26.0–34.0)
MCHC: 34.2 g/dL (ref 30.0–36.0)
MCV: 86.7 fL (ref 80.0–100.0)
Platelets: 143 10*3/uL — ABNORMAL LOW (ref 150–400)
RBC: 4.42 MIL/uL (ref 3.87–5.11)
RDW: 13.3 % (ref 11.5–15.5)
WBC: 11.1 10*3/uL — ABNORMAL HIGH (ref 4.0–10.5)
nRBC: 0 % (ref 0.0–0.2)

## 2019-01-30 LAB — POCT I-STAT, CHEM 8
BUN: 7 mg/dL (ref 6–20)
Calcium, Ion: 0.95 mmol/L — ABNORMAL LOW (ref 1.15–1.40)
Chloride: 104 mmol/L (ref 98–111)
Creatinine, Ser: 0.6 mg/dL (ref 0.44–1.00)
Glucose, Bld: 172 mg/dL — ABNORMAL HIGH (ref 70–99)
HCT: 26 % — ABNORMAL LOW (ref 36.0–46.0)
Hemoglobin: 8.8 g/dL — ABNORMAL LOW (ref 12.0–15.0)
Potassium: 3.4 mmol/L — ABNORMAL LOW (ref 3.5–5.1)
Sodium: 140 mmol/L (ref 135–145)
TCO2: 22 mmol/L (ref 22–32)

## 2019-01-30 LAB — HEMOGLOBIN AND HEMATOCRIT, BLOOD
HCT: 28.8 % — ABNORMAL LOW (ref 36.0–46.0)
Hemoglobin: 9.7 g/dL — ABNORMAL LOW (ref 12.0–15.0)

## 2019-01-30 LAB — CDS SEROLOGY

## 2019-01-30 LAB — LACTIC ACID, PLASMA: Lactic Acid, Venous: 1.2 mmol/L (ref 0.5–1.9)

## 2019-01-30 LAB — MRSA PCR SCREENING: MRSA by PCR: POSITIVE — AB

## 2019-01-30 LAB — HIV ANTIBODY (ROUTINE TESTING W REFLEX): HIV Screen 4th Generation wRfx: NONREACTIVE

## 2019-01-30 SURGERY — EXTERNAL FIXATION, LOWER EXTREMITY
Anesthesia: General | Site: Shoulder | Laterality: Right

## 2019-01-30 MED ORDER — SODIUM CHLORIDE 0.9 % IV SOLN
INTRAVENOUS | Status: DC | PRN
  Administered 2019-01-30 – 2019-01-31 (×2): 1000 mL via INTRAVENOUS
  Administered 2019-02-02: 250 mL via INTRAVENOUS

## 2019-01-30 MED ORDER — CHLORHEXIDINE GLUCONATE CLOTH 2 % EX PADS
6.0000 | MEDICATED_PAD | Freq: Every day | CUTANEOUS | Status: DC
  Administered 2019-01-30 – 2019-02-10 (×9): 6 via TOPICAL

## 2019-01-30 MED ORDER — VANCOMYCIN HCL 1000 MG IV SOLR
INTRAVENOUS | Status: AC
  Filled 2019-01-30: qty 1000

## 2019-01-30 MED ORDER — FENTANYL CITRATE (PF) 250 MCG/5ML IJ SOLN
INTRAMUSCULAR | Status: AC
  Filled 2019-01-30: qty 5

## 2019-01-30 MED ORDER — CEFAZOLIN SODIUM-DEXTROSE 2-3 GM-%(50ML) IV SOLR
INTRAVENOUS | Status: DC | PRN
  Administered 2019-01-30: 2 g via INTRAVENOUS

## 2019-01-30 MED ORDER — FENTANYL CITRATE (PF) 250 MCG/5ML IJ SOLN
INTRAMUSCULAR | Status: DC | PRN
  Administered 2019-01-30 (×10): 50 ug via INTRAVENOUS

## 2019-01-30 MED ORDER — 0.9 % SODIUM CHLORIDE (POUR BTL) OPTIME
TOPICAL | Status: DC | PRN
  Administered 2019-01-30: 1000 mL

## 2019-01-30 MED ORDER — TOBRAMYCIN SULFATE 1.2 G IJ SOLR
INTRAMUSCULAR | Status: DC | PRN
  Administered 2019-01-30: 1.2 g

## 2019-01-30 MED ORDER — ALBUMIN HUMAN 5 % IV SOLN
12.5000 g | Freq: Once | INTRAVENOUS | Status: AC
  Administered 2019-01-30: 12.5 g via INTRAVENOUS
  Filled 2019-01-30: qty 250

## 2019-01-30 MED ORDER — ALBUMIN HUMAN 5 % IV SOLN: INTRAVENOUS | Status: DC | PRN

## 2019-01-30 MED ORDER — VANCOMYCIN HCL 1000 MG IV SOLR
INTRAVENOUS | Status: DC | PRN
  Administered 2019-01-30: 2000 mg

## 2019-01-30 MED ORDER — POTASSIUM CHLORIDE 10 MEQ/100ML IV SOLN
INTRAVENOUS | Status: AC
  Administered 2019-01-30: 04:00:00 10 meq via INTRAVENOUS
  Filled 2019-01-30: qty 100

## 2019-01-30 MED ORDER — MIDAZOLAM HCL 2 MG/2ML IJ SOLN
INTRAMUSCULAR | Status: DC | PRN
  Administered 2019-01-30: 2 mg via INTRAVENOUS

## 2019-01-30 MED ORDER — PHENYLEPHRINE HCL-NACL 10-0.9 MG/250ML-% IV SOLN
INTRAVENOUS | Status: DC | PRN
  Administered 2019-01-30: 30 ug/min via INTRAVENOUS

## 2019-01-30 MED ORDER — METHYLENE BLUE 0.5 % INJ SOLN
INTRAVENOUS | Status: DC | PRN
  Administered 2019-01-30: 1 mL

## 2019-01-30 MED ORDER — LACTATED RINGERS IV SOLN: INTRAVENOUS | Status: DC | PRN

## 2019-01-30 MED ORDER — POTASSIUM CHLORIDE 10 MEQ/100ML IV SOLN
10.0000 meq | INTRAVENOUS | Status: AC
  Administered 2019-01-30 (×3): 10 meq via INTRAVENOUS
  Filled 2019-01-30 (×3): qty 100

## 2019-01-30 MED ORDER — PROPOFOL 10 MG/ML IV BOLUS
INTRAVENOUS | Status: AC
  Filled 2019-01-30: qty 20

## 2019-01-30 MED ORDER — ROCURONIUM BROMIDE 10 MG/ML (PF) SYRINGE
PREFILLED_SYRINGE | INTRAVENOUS | Status: DC | PRN
  Administered 2019-01-30: 100 mg via INTRAVENOUS

## 2019-01-30 MED ORDER — SODIUM CHLORIDE 0.9 % IR SOLN
Status: DC | PRN
  Administered 2019-01-30 (×3): 3000 mL

## 2019-01-30 MED ORDER — PHENYLEPHRINE 40 MCG/ML (10ML) SYRINGE FOR IV PUSH (FOR BLOOD PRESSURE SUPPORT)
PREFILLED_SYRINGE | INTRAVENOUS | Status: DC | PRN
  Administered 2019-01-30: 120 ug via INTRAVENOUS
  Administered 2019-01-30: 40 ug via INTRAVENOUS
  Administered 2019-01-30: 80 ug via INTRAVENOUS
  Administered 2019-01-30: 160 ug via INTRAVENOUS

## 2019-01-30 MED ORDER — MUPIROCIN 2 % EX OINT
1.0000 "application " | TOPICAL_OINTMENT | Freq: Two times a day (BID) | CUTANEOUS | Status: AC
  Administered 2019-01-30 – 2019-02-03 (×10): 1 via NASAL
  Filled 2019-01-30: qty 22

## 2019-01-30 MED ORDER — SODIUM CHLORIDE 0.9 % IV SOLN
2.0000 g | INTRAVENOUS | Status: AC
  Administered 2019-01-30 – 2019-02-01 (×3): 2 g via INTRAVENOUS
  Filled 2019-01-30: qty 20
  Filled 2019-01-30: qty 2
  Filled 2019-01-30: qty 20
  Filled 2019-01-30: qty 2

## 2019-01-30 MED ORDER — PROPOFOL 500 MG/50ML IV EMUL
INTRAVENOUS | Status: DC | PRN
  Administered 2019-01-30: 40 ug/kg/min via INTRAVENOUS

## 2019-01-30 MED ORDER — MIDAZOLAM HCL 2 MG/2ML IJ SOLN
INTRAMUSCULAR | Status: AC
  Filled 2019-01-30: qty 2

## 2019-01-30 MED ORDER — SODIUM CHLORIDE 0.9 % IV BOLUS: 500.0000 mL | Freq: Two times a day (BID) | INTRAVENOUS | Status: DC | PRN

## 2019-01-30 MED ORDER — CALCIUM GLUCONATE-NACL 1-0.675 GM/50ML-% IV SOLN
1.0000 g | Freq: Once | INTRAVENOUS | Status: AC
  Administered 2019-01-30: 1000 mg via INTRAVENOUS
  Filled 2019-01-30: qty 50

## 2019-01-30 MED ORDER — METHYLENE BLUE 0.5 % INJ SOLN
INTRAVENOUS | Status: AC
  Filled 2019-01-30: qty 10

## 2019-01-30 MED ORDER — PROPOFOL 10 MG/ML IV BOLUS
INTRAVENOUS | Status: DC | PRN
  Administered 2019-01-30 (×2): 50 mg via INTRAVENOUS

## 2019-01-30 MED FILL — Thrombin For Soln 5000 Unit: CUTANEOUS | Qty: 5000 | Status: AC

## 2019-01-30 SURGICAL SUPPLY — 72 items
BAR GLASS FIBER EXFX 11X500 (EXFIX) ×6 IMPLANT
BIT DRILL CANN MED FLUTE 4.0 (BIT) ×2 IMPLANT
BIT DRILL ORANGE 4.0 LONG (BIT) ×3 IMPLANT
BNDG COHESIVE 4X5 TAN STRL (GAUZE/BANDAGES/DRESSINGS) ×3 IMPLANT
BNDG ELASTIC 4X5.8 VLCR STR LF (GAUZE/BANDAGES/DRESSINGS) ×3 IMPLANT
BNDG ELASTIC 6X5.8 VLCR STR LF (GAUZE/BANDAGES/DRESSINGS) ×3 IMPLANT
BNDG GAUZE ELAST 4 BULKY (GAUZE/BANDAGES/DRESSINGS) ×6 IMPLANT
BOWL SMART MIX CTS (DISPOSABLE) ×3 IMPLANT
BRUSH SCRUB EZ PLAIN DRY (MISCELLANEOUS) ×6 IMPLANT
CANISTER WOUNDNEG PRESSURE 500 (CANNISTER) ×3 IMPLANT
CEMENT BONE SIMPLEX SPEEDSET (Cement) ×3 IMPLANT
CHLORAPREP W/TINT 26 (MISCELLANEOUS) ×3 IMPLANT
COVER MAYO STAND STRL (DRAPES) ×3 IMPLANT
COVER SURGICAL LIGHT HANDLE (MISCELLANEOUS) ×3 IMPLANT
COVER WAND RF STERILE (DRAPES) ×3 IMPLANT
DRAPE C-ARM 42X72 X-RAY (DRAPES) IMPLANT
DRAPE C-ARMOR (DRAPES) ×3 IMPLANT
DRAPE IMP U-DRAPE 54X76 (DRAPES) ×6 IMPLANT
DRAPE ORTHO SPLIT 77X108 STRL (DRAPES) ×2
DRAPE SURG 17X23 STRL (DRAPES) ×3 IMPLANT
DRAPE SURG ORHT 6 SPLT 77X108 (DRAPES) ×4 IMPLANT
DRAPE U-SHAPE 47X51 STRL (DRAPES) ×3 IMPLANT
DRESSING PEEL AND PLAC PRVNA20 (GAUZE/BANDAGES/DRESSINGS) ×2 IMPLANT
DRILL CANN 4.0MM (BIT) ×3
DRSG ADAPTIC 3X8 NADH LF (GAUZE/BANDAGES/DRESSINGS) ×3 IMPLANT
DRSG MEPILEX BORDER 4X4 (GAUZE/BANDAGES/DRESSINGS) ×3 IMPLANT
DRSG MEPITEL 4X7.2 (GAUZE/BANDAGES/DRESSINGS) IMPLANT
DRSG PEEL AND PLACE PREVENA 20 (GAUZE/BANDAGES/DRESSINGS) ×3
ELECT REM PT RETURN 9FT ADLT (ELECTROSURGICAL) ×3
ELECTRODE REM PT RTRN 9FT ADLT (ELECTROSURGICAL) ×2 IMPLANT
EVACUATOR 1/8 PVC DRAIN (DRAIN) IMPLANT
GAUZE SPONGE 4X4 12PLY STRL (GAUZE/BANDAGES/DRESSINGS) ×3 IMPLANT
GLOVE BIO SURGEON STRL SZ 6.5 (GLOVE) ×9 IMPLANT
GLOVE BIO SURGEON STRL SZ7.5 (GLOVE) ×12 IMPLANT
GLOVE BIOGEL PI IND STRL 6.5 (GLOVE) ×2 IMPLANT
GLOVE BIOGEL PI IND STRL 7.5 (GLOVE) ×2 IMPLANT
GLOVE BIOGEL PI INDICATOR 6.5 (GLOVE) ×1
GLOVE BIOGEL PI INDICATOR 7.5 (GLOVE) ×1
GOWN STRL REUS W/ TWL LRG LVL3 (GOWN DISPOSABLE) ×4 IMPLANT
GOWN STRL REUS W/TWL LRG LVL3 (GOWN DISPOSABLE) ×2
HALF PIN 5.0X160 (EXFIX) ×3 IMPLANT
HANDPIECE INTERPULSE COAX TIP (DISPOSABLE)
KIT BASIN OR (CUSTOM PROCEDURE TRAY) ×3 IMPLANT
KIT TURNOVER KIT B (KITS) ×3 IMPLANT
MANIFOLD NEPTUNE II (INSTRUMENTS) ×3 IMPLANT
NEEDLE 22X1 1/2 (OR ONLY) (NEEDLE) IMPLANT
NS IRRIG 1000ML POUR BTL (IV SOLUTION) ×3 IMPLANT
PACK ORTHO EXTREMITY (CUSTOM PROCEDURE TRAY) ×3 IMPLANT
PAD ARMBOARD 7.5X6 YLW CONV (MISCELLANEOUS) ×6 IMPLANT
PADDING CAST COTTON 6X4 STRL (CAST SUPPLIES) ×6 IMPLANT
PIN CLAMP 2BAR 75MM BLUE (EXFIX) ×6 IMPLANT
PIN HALF 5X160X35 BLUNT TIP (EXFIX) ×6 IMPLANT
PIN XTRAFIX LG BLUNT 5X200X45 (EXFIX) ×6 IMPLANT
SET HNDPC FAN SPRY TIP SCT (DISPOSABLE) IMPLANT
SET INTERPULSE LAVAGE W/TIP (ORTHOPEDIC DISPOSABLE SUPPLIES) ×3 IMPLANT
SPONGE LAP 18X18 RF (DISPOSABLE) ×3 IMPLANT
STAPLER VISISTAT 35W (STAPLE) ×3 IMPLANT
STRIP CLOSURE SKIN 1/2X4 (GAUZE/BANDAGES/DRESSINGS) IMPLANT
SUT ETHILON 2 0 FS 18 (SUTURE) ×6 IMPLANT
SUT ETHILON 3 0 FSL (SUTURE) ×3 IMPLANT
SUT ETHILON 3 0 PS 1 (SUTURE) ×6 IMPLANT
SUT MON AB 2-0 CT1 36 (SUTURE) ×3 IMPLANT
SUT PDS AB 0 CT 36 (SUTURE) IMPLANT
SUT PROLENE 0 CT (SUTURE) IMPLANT
SUT PROLENE 1 CT 1 30 (SUTURE) ×3 IMPLANT
SWAB CULTURE ESWAB REG 1ML (MISCELLANEOUS) IMPLANT
TOWEL GREEN STERILE (TOWEL DISPOSABLE) ×6 IMPLANT
TOWEL GREEN STERILE FF (TOWEL DISPOSABLE) ×6 IMPLANT
TUBE CONNECTING 12X1/4 (SUCTIONS) ×3 IMPLANT
UNDERPAD 30X30 (UNDERPADS AND DIAPERS) ×3 IMPLANT
WATER STERILE IRR 1000ML POUR (IV SOLUTION) ×3 IMPLANT
YANKAUER SUCT BULB TIP NO VENT (SUCTIONS) ×3 IMPLANT

## 2019-01-30 NOTE — Anesthesia Postprocedure Evaluation (Signed)
Anesthesia Post Note  Patient: Susan Wilkinson  Procedure(s) Performed: Left Hemi Craniectomy for Subdural Hematoma (Left Head)     Patient location during evaluation: ICU Anesthesia Type: General Level of consciousness: patient remains intubated per anesthesia plan Pain management: pain level controlled Vital Signs Assessment: post-procedure vital signs reviewed and stable Respiratory status: patient remains intubated per anesthesia plan Postop Assessment: no apparent nausea or vomiting Anesthetic complications: no    Last Vitals:  Vitals:   01/29/19 2347 01/30/19 0000  BP:  (!) 122/98  Pulse: 98 88  Resp: 18 18  Temp:    SpO2: 100% 100%    Last Pain:  Vitals:   01/29/19 1930  TempSrc:   PainSc: Asleep                 [place]

## 2019-01-30 NOTE — Progress Notes (Signed)
Ortho Trauma Progress Note  Plan to proceed to OR for irrigation and debridement with external fixation of right femur +/- antibiotic bead placement. Risks and benefits discussed with patient's father over the phone and he agreed to surgery and obtained consent. Will need definitive fixation of clavicle and left proximal humerus likely next week.  Head CT performed this morning appears to be improved from prior to her subdural evacuation.  Roby Lofts, MD [facility] [phone] (office) [email or web address]

## 2019-01-30 NOTE — Transfer of Care (Signed)
Immediate Anesthesia Transfer of Care Note  Patient: Susan Wilkinson  Procedure(s) Performed: EXTERNAL FIXATION RIGHT FEMUR (Right Leg Lower) IRRIGATION AND DEBRIDEMENT EXTREMITY (Right Leg Lower) Irrigation And Debridement Wound (Right Shoulder)  Patient Location: ICU  Anesthesia Type:General  Level of Consciousness: Patient remains intubated per anesthesia plan  Airway & Oxygen Therapy: Patient remains intubated per anesthesia plan and Patient placed on Ventilator (see vital sign flow sheet for setting)  Post-op Assessment: Report given to RN and Post -op Vital signs reviewed and stable  Post vital signs: Reviewed and stable  Last Vitals:  Vitals Value Taken Time  BP    Temp    Pulse    Resp    SpO2      Last Pain:  Vitals:   01/30/19 1136  TempSrc: Axillary  PainSc:          Complications: No apparent anesthesia complications
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
## 2019-01-30 NOTE — OR Nursing (Signed)
NOTIFIED ICU RN AT 14:00 WE WERE DONE WITH PROCEDURE AND WOULD BE ARRIVING IN 5-10 MIN.
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
## 2019-01-30 NOTE — Progress Notes (Addendum)
Spoke with Dr.Wakefield after admission to floor- gave prn normal saline boluses x2 if needed and to go ahead and given K+ infusions from earlier in night.   He agreed with the plan to keep her sedated throughout the night, especially since she will be going to the OR tomorrow for leg repair.  Family was contacted and updated

## 2019-01-30 NOTE — Progress Notes (Signed)
Orthopedic Tech Progress Note Patient Details:  Susan Wilkinson 1996/01/17 [id number] RN said patient has on collar Patient ID: Susan Wilkinson, female   DOB: November 12, 1995, 24 y.o.   MRN: [id number]   Donald Pore 01/30/2019, 8:28 AM

## 2019-01-30 NOTE — Progress Notes (Signed)
RT note: RT and RN transported vent patient to CT and back to 4N18. Vital signs stable through out.
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
## 2019-01-30 NOTE — Progress Notes (Signed)
Rt note: RT and RN transported vent patient to CT and back to 4N18, second trip. Vital signs stable through out.

## 2019-01-30 NOTE — Anesthesia Postprocedure Evaluation (Signed)
Anesthesia Post Note  Patient: Susan Wilkinson  Procedure(s) Performed: EXTERNAL FIXATION RIGHT FEMUR (Right Leg Lower) IRRIGATION AND DEBRIDEMENT EXTREMITY (Right Leg Lower) Irrigation And Debridement Wound (Right Shoulder)     Patient location during evaluation: ICU Anesthesia Type: General Level of consciousness: sedated Pain management: pain level controlled Vital Signs Assessment: post-procedure vital signs reviewed and stable Respiratory status: patient remains intubated per anesthesia plan Cardiovascular status: stable Postop Assessment: no apparent nausea or vomiting Anesthetic complications: no    Last Vitals:  Vitals:   01/30/19 1136 01/30/19 1141  BP:  119/70  Pulse:  93  Resp:  19  Temp: 37.1 C   SpO2:  100%    Last Pain:  Vitals:   01/30/19 1136  TempSrc: Axillary  PainSc:                  Susan Wilkinson
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
## 2019-01-30 NOTE — Transfer of Care (Signed)
Immediate Anesthesia Transfer of Care Note  Patient: Susan Wilkinson  Procedure(s) Performed: Left Hemi Craniectomy for Subdural Hematoma (Left Head)  Patient Location: ICU  Anesthesia Type:General  Level of Consciousness: Patient remains intubated per anesthesia plan  Airway & Oxygen Therapy: Patient remains intubated per anesthesia plan and Patient placed on Ventilator (see vital sign flow sheet for setting)  Post-op Assessment: Report given to RN and Post -op Vital signs reviewed and stable  Post vital signs: Reviewed and stable  Last Vitals:  Vitals Value Taken Time  BP 122/98 01/30/19 0000  Temp    Pulse 84 01/30/19 0007  Resp 18 01/30/19 0007  SpO2 100 % 01/30/19 0007  Vitals shown include unvalidated device data.  Last Pain:  Vitals:   01/29/19 1930  TempSrc:   PainSc: Asleep         Complications: No apparent anesthesia complications

## 2019-01-31 ENCOUNTER — Encounter: Payer: Self-pay | Admitting: *Deleted

## 2019-01-31 ENCOUNTER — Inpatient Hospital Stay (HOSPITAL_COMMUNITY): Payer: No Typology Code available for payment source

## 2019-01-31 LAB — POCT I-STAT 7, (LYTES, BLD GAS, ICA,H+H)
Acid-base deficit: 4 mmol/L — ABNORMAL HIGH (ref 0.0–2.0)
Bicarbonate: 21.3 mmol/L (ref 20.0–28.0)
Calcium, Ion: 1.21 mmol/L (ref 1.15–1.40)
HCT: 26 % — ABNORMAL LOW (ref 36.0–46.0)
Hemoglobin: 8.8 g/dL — ABNORMAL LOW (ref 12.0–15.0)
O2 Saturation: 100 %
Patient temperature: 97.8
Potassium: 3.3 mmol/L — ABNORMAL LOW (ref 3.5–5.1)
Sodium: 140 mmol/L (ref 135–145)
TCO2: 22 mmol/L (ref 22–32)
pCO2 arterial: 36 mmHg (ref 32.0–48.0)
pH, Arterial: 7.378 (ref 7.350–7.450)
pO2, Arterial: 172 mmHg — ABNORMAL HIGH (ref 83.0–108.0)

## 2019-01-31 LAB — CBC
HCT: 27 % — ABNORMAL LOW (ref 36.0–46.0)
Hemoglobin: 8.9 g/dL — ABNORMAL LOW (ref 12.0–15.0)
MCH: 29.3 pg (ref 26.0–34.0)
MCHC: 33 g/dL (ref 30.0–36.0)
MCV: 88.8 fL (ref 80.0–100.0)
Platelets: 82 10*3/uL — ABNORMAL LOW (ref 150–400)
RBC: 3.04 MIL/uL — ABNORMAL LOW (ref 3.87–5.11)
RDW: 14.2 % (ref 11.5–15.5)
WBC: 8.4 10*3/uL (ref 4.0–10.5)
nRBC: 0 % (ref 0.0–0.2)

## 2019-01-31 LAB — HEMOGLOBIN AND HEMATOCRIT, BLOOD
HCT: 28.1 % — ABNORMAL LOW (ref 36.0–46.0)
HCT: 24.2 % — ABNORMAL LOW (ref 36.0–46.0)
Hemoglobin: 9.6 g/dL — ABNORMAL LOW (ref 12.0–15.0)
Hemoglobin: 8.1 g/dL — ABNORMAL LOW (ref 12.0–15.0)

## 2019-01-31 LAB — BASIC METABOLIC PANEL
Anion gap: 5 (ref 5–15)
BUN: <5 mg/dL — ABNORMAL LOW (ref 6–20)
CO2: 22 mmol/L (ref 22–32)
Calcium: 7.5 mg/dL — ABNORMAL LOW (ref 8.9–10.3)
Chloride: 113 mmol/L — ABNORMAL HIGH (ref 98–111)
Creatinine, Ser: 0.59 mg/dL (ref 0.44–1.00)
GFR calc Af Amer: >60 mL/min (ref >60)
GFR calc non Af Amer: >60 mL/min (ref >60)
Glucose, Bld: 105 mg/dL — ABNORMAL HIGH (ref 70–99)
Potassium: 3.4 mmol/L — ABNORMAL LOW (ref 3.5–5.1)
Sodium: 140 mmol/L (ref 135–145)

## 2019-01-31 LAB — GLUCOSE, CAPILLARY
Glucose-Capillary: 126 mg/dL — ABNORMAL HIGH (ref 70–99)
Glucose-Capillary: 106 mg/dL — ABNORMAL HIGH (ref 70–99)
Glucose-Capillary: 130 mg/dL — ABNORMAL HIGH (ref 70–99)
Glucose-Capillary: 143 mg/dL — ABNORMAL HIGH (ref 70–99)

## 2019-01-31 LAB — TRIGLYCERIDES: Triglycerides: 108 mg/dL (ref <150)

## 2019-01-31 LAB — VITAMIN D 25 HYDROXY (VIT D DEFICIENCY, FRACTURES): Vit D, 25-Hydroxy: 16.58 ng/mL — ABNORMAL LOW (ref 30–100)

## 2019-01-31 MED ORDER — VITAL HIGH PROTEIN PO LIQD: 1000.0000 mL | ORAL | Status: DC

## 2019-01-31 MED ORDER — FENTANYL 2500MCG IN NS 250ML (10MCG/ML) PREMIX INFUSION: 50.0000 ug/h | INTRAVENOUS | Status: DC

## 2019-01-31 MED ORDER — POTASSIUM CHLORIDE 20 MEQ/15ML (10%) PO SOLN
40.0000 meq | Freq: Once | ORAL | Status: AC
  Administered 2019-01-31: 40 meq

## 2019-01-31 MED ORDER — OXYCODONE HCL 5 MG/5ML PO SOLN
5.0000 mg | ORAL | Status: DC | PRN
  Administered 2019-01-31 – 2019-02-01 (×2): 10 mg
  Administered 2019-02-01: 5 mg
  Administered 2019-02-01 – 2019-02-04 (×11): 10 mg
  Filled 2019-01-31 (×3): qty 10
  Filled 2019-01-31: qty 5
  Filled 2019-01-31 (×10): qty 10

## 2019-01-31 MED ORDER — ENOXAPARIN SODIUM 30 MG/0.3ML ~~LOC~~ SOLN
30.0000 mg | Freq: Two times a day (BID) | SUBCUTANEOUS | Status: DC
  Administered 2019-01-31 – 2019-02-10 (×19): 30 mg via SUBCUTANEOUS
  Filled 2019-01-31 (×18): qty 0.3

## 2019-01-31 MED ORDER — ACETAMINOPHEN 160 MG/5ML PO SOLN
650.0000 mg | ORAL | Status: DC | PRN
  Administered 2019-01-31 – 2019-02-02 (×3): 650 mg
  Filled 2019-01-31 (×3): qty 20.3

## 2019-01-31 MED ORDER — QUETIAPINE FUMARATE 25 MG PO TABS
50.0000 mg | ORAL_TABLET | Freq: Two times a day (BID) | ORAL | Status: DC
  Administered 2019-01-31: 50 mg via ORAL
  Filled 2019-01-31 (×2): qty 2

## 2019-01-31 MED ORDER — METHOCARBAMOL 500 MG PO TABS
1000.0000 mg | ORAL_TABLET | Freq: Three times a day (TID) | ORAL | Status: DC
  Filled 2019-01-31: qty 2

## 2019-01-31 MED ORDER — FENTANYL BOLUS VIA INFUSION
50.0000 ug | INTRAVENOUS | Status: DC | PRN
  Filled 2019-01-31: qty 50

## 2019-01-31 MED ORDER — DEXMEDETOMIDINE HCL IN NACL 400 MCG/100ML IV SOLN
0.4000 ug/kg/h | INTRAVENOUS | Status: AC
  Administered 2019-01-31 (×2): 0.4 ug/kg/h via INTRAVENOUS
  Administered 2019-02-02: 16:00:00 0.5 ug/kg/h via INTRAVENOUS
  Administered 2019-02-02: 0.7 ug/kg/h via INTRAVENOUS
  Administered 2019-02-03: 07:00:00 0.5 ug/kg/h via INTRAVENOUS
  Filled 2019-01-31 (×6): qty 100

## 2019-01-31 MED ORDER — POTASSIUM CHLORIDE 20 MEQ/15ML (10%) PO SOLN
40.0000 meq | Freq: Once | ORAL | Status: DC
  Filled 2019-01-31: qty 30

## 2019-01-31 MED ORDER — FENTANYL CITRATE (PF) 100 MCG/2ML IJ SOLN
50.0000 ug | Freq: Once | INTRAMUSCULAR | Status: DC
  Filled 2019-01-31: qty 2

## 2019-01-31 MED ORDER — PIVOT 1.5 CAL PO LIQD
1000.0000 mL | ORAL | Status: DC
  Administered 2019-01-31 (×2): 1000 mL

## 2019-01-31 MED ORDER — METHOCARBAMOL 500 MG PO TABS
1000.0000 mg | ORAL_TABLET | Freq: Three times a day (TID) | ORAL | Status: DC
  Administered 2019-01-31 – 2019-02-03 (×10): 1000 mg
  Filled 2019-01-31 (×10): qty 2

## 2019-01-31 MED ORDER — OXYCODONE HCL 5 MG/5ML PO SOLN
5.0000 mg | ORAL | Status: DC | PRN
  Administered 2019-01-31: 13:00:00 5 mg
  Filled 2019-01-31: qty 5

## 2019-01-31 MED ORDER — PRO-STAT SUGAR FREE PO LIQD
30.0000 mL | Freq: Two times a day (BID) | ORAL | Status: DC
  Administered 2019-01-31: 30 mL
  Filled 2019-01-31: qty 30
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
## 2019-01-31 NOTE — Progress Notes (Signed)
CT c-spine reviewed and negative for fracture. Clinical exam performed to evaluate for ligamentous injury. C-spine evaluation performed. No midline pain or TTP. Patient able to turn head left and right without midline cervical pain. Neck flexion and extension performed without midline pain. C-spine cleared and collar removed.   Staisha Winiarski N. Cagney Steenson, MD General and Trauma Surgery [facility]   

## 2019-01-31 NOTE — Procedures (Signed)
Extubation Procedure Note  Patient Details:   Name: Dajanique Robley DOB: October 11, 1995 MRN: [id number]   Airway Documentation:    Vent end date: 01/31/19 Vent end time: 1608   Evaluation  O2 sats: stable throughout Complications: No apparent complications Patient did tolerate procedure well. Bilateral Breath Sounds: Diminished, Rhonchi   Yes   Pt was extubated to [facility] per MD order. Pt was suctioned and had a positive cuff leak prior to extubation. Pt was able to say her name afterwards. Pt saturations are 100% at this time and says she feels good. No stridor was noted afterwards. RT will continue to monitor pt.   Theodus Ran A Ismar Yabut 01/31/2019, 4:11 PM
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
## 2019-01-31 NOTE — Procedures (Signed)
Cortrak  Person Inserting Tube:  Dorion Petillo, RD Tube Type:  Cortrak - 43 inches Tube Location:  Left nare Initial Placement:  Stomach Secured by: Bridle Technique Used to Measure Tube Placement:  Documented cm marking at nare/ corner of mouth Cortrak Secured At:  72 cm    No x-ray is required. RN may begin using tube.    If the tube becomes dislodged please keep the tube and contact the Cortrak team at [email or web address] ([email or web address]) for replacement.  If after hours and replacement cannot be delayed, place a NG tube and confirm placement with an abdominal x-ray.    Vanessa Kick RD, LDN Clinical Nutrition Pager # [phone]

## 2019-01-31 NOTE — Progress Notes (Signed)
Trauma MD currently on unit, gave RN verbal order to extubate pt to [place] this afternoon. RN will coordinate with RT for extubation. Pt VSS, following simple commands and is able to cough. Pt lung sounds clear to auscultation. TF has been held.
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
## 2019-02-01 LAB — BASIC METABOLIC PANEL
Anion gap: 6 (ref 5–15)
BUN: 6 mg/dL (ref 6–20)
CO2: 21 mmol/L — ABNORMAL LOW (ref 22–32)
Calcium: 7.7 mg/dL — ABNORMAL LOW (ref 8.9–10.3)
Chloride: 112 mmol/L — ABNORMAL HIGH (ref 98–111)
Creatinine, Ser: 0.43 mg/dL — ABNORMAL LOW (ref 0.44–1.00)
GFR calc Af Amer: >60 mL/min (ref >60)
GFR calc non Af Amer: >60 mL/min (ref >60)
Glucose, Bld: 147 mg/dL — ABNORMAL HIGH (ref 70–99)
Potassium: 3.5 mmol/L (ref 3.5–5.1)
Sodium: 139 mmol/L (ref 135–145)

## 2019-02-01 LAB — CBC
HCT: 23.8 % — ABNORMAL LOW (ref 36.0–46.0)
HCT: 26 % — ABNORMAL LOW (ref 36.0–46.0)
Hemoglobin: 7.8 g/dL — ABNORMAL LOW (ref 12.0–15.0)
Hemoglobin: 8.6 g/dL — ABNORMAL LOW (ref 12.0–15.0)
MCH: 29 pg (ref 26.0–34.0)
MCH: 27.4 pg (ref 26.0–34.0)
MCHC: 32.8 g/dL (ref 30.0–36.0)
MCHC: 33.1 g/dL (ref 30.0–36.0)
MCV: 88.5 fL (ref 80.0–100.0)
MCV: 82.8 fL (ref 80.0–100.0)
Platelets: 98 10*3/uL — ABNORMAL LOW (ref 150–400)
Platelets: 114 10*3/uL — ABNORMAL LOW (ref 150–400)
RBC: 2.69 MIL/uL — ABNORMAL LOW (ref 3.87–5.11)
RBC: 3.14 MIL/uL — ABNORMAL LOW (ref 3.87–5.11)
RDW: 14.6 % (ref 11.5–15.5)
RDW: 17 % — ABNORMAL HIGH (ref 11.5–15.5)
WBC: 7.4 10*3/uL (ref 4.0–10.5)
WBC: 7.5 10*3/uL (ref 4.0–10.5)
nRBC: 0 % (ref 0.0–0.2)
nRBC: 0 % (ref 0.0–0.2)

## 2019-02-01 LAB — MAGNESIUM: Magnesium: 1.8 mg/dL (ref 1.7–2.4)

## 2019-02-01 LAB — PHOSPHORUS: Phosphorus: 1.5 mg/dL — ABNORMAL LOW (ref 2.5–4.6)

## 2019-02-01 LAB — GLUCOSE, CAPILLARY
Glucose-Capillary: 139 mg/dL — ABNORMAL HIGH (ref 70–99)
Glucose-Capillary: 131 mg/dL — ABNORMAL HIGH (ref 70–99)
Glucose-Capillary: 139 mg/dL — ABNORMAL HIGH (ref 70–99)
Glucose-Capillary: 131 mg/dL — ABNORMAL HIGH (ref 70–99)
Glucose-Capillary: 131 mg/dL — ABNORMAL HIGH (ref 70–99)
Glucose-Capillary: 145 mg/dL — ABNORMAL HIGH (ref 70–99)

## 2019-02-01 LAB — PREPARE RBC (CROSSMATCH)

## 2019-02-01 MED ORDER — POTASSIUM PHOSPHATES 15 MMOLE/5ML IV SOLN
30.0000 mmol | Freq: Once | INTRAVENOUS | Status: AC
  Administered 2019-02-01: 30 mmol via INTRAVENOUS
  Filled 2019-02-01: qty 10

## 2019-02-01 MED ORDER — SODIUM CHLORIDE 0.9% IV SOLUTION: Freq: Once | INTRAVENOUS | Status: AC

## 2019-02-01 MED ORDER — QUETIAPINE FUMARATE 25 MG PO TABS
50.0000 mg | ORAL_TABLET | Freq: Two times a day (BID) | ORAL | Status: DC
  Administered 2019-02-01 – 2019-02-02 (×3): 50 mg
  Filled 2019-02-01 (×2): qty 2

## 2019-02-01 NOTE — Progress Notes (Signed)
Assisted tele visit to patient with family member.  Areebah Meinders P, RN  

## 2019-02-01 NOTE — Progress Notes (Signed)
Assisted tele visit to patient with family member.  Dawood Spitler P, RN  
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
## 2019-02-01 NOTE — Progress Notes (Signed)
  NEUROSURGERY PROGRESS NOTE   No issues overnight.   EXAM:  BP 115/77   Pulse (!) 105   Temp (!) 97.3 F (36.3 C)   Resp (!) 27   Ht 5\' 3"  (1.6 m)   Wt 62.8 kg   SpO2 98%   BMI 24.53 kg/m   Awake, alert, Speech fluent, appropriate  CN grossly intact  Moving LUE/LLE well.  Moves RUE at least antigravity RLE ex-fix in place, wiggles toes Wound c/d/i, drain in place >200cc overnight  IMPRESSION:  24 y.o. female POD# 3 s/p left decompressive craniectomy for SDH, progressing from neurosurgical standpoint.   PLAN: - Cont drain for today, d/c tomorrow regardless of output - Cont care per trauma/ortho

## 2019-02-02 DIAGNOSIS — S065X9A Traumatic subdural hemorrhage with loss of consciousness of unspecified duration, initial encounter: Secondary | ICD-10-CM

## 2019-02-02 DIAGNOSIS — S81811A Laceration without foreign body, right lower leg, initial encounter: Secondary | ICD-10-CM

## 2019-02-02 DIAGNOSIS — S82044A Nondisplaced comminuted fracture of right patella, initial encounter for closed fracture: Secondary | ICD-10-CM

## 2019-02-02 DIAGNOSIS — S065XAA Traumatic subdural hemorrhage with loss of consciousness status unknown, initial encounter: Secondary | ICD-10-CM

## 2019-02-02 DIAGNOSIS — S36039A Unspecified laceration of spleen, initial encounter: Secondary | ICD-10-CM

## 2019-02-02 DIAGNOSIS — S42033B Displaced fracture of lateral end of unspecified clavicle, initial encounter for open fracture: Secondary | ICD-10-CM

## 2019-02-02 DIAGNOSIS — S72463C Displaced supracondylar fracture with intracondylar extension of lower end of unspecified femur, initial encounter for open fracture type IIIA, IIIB, or IIIC: Secondary | ICD-10-CM | POA: Insufficient documentation

## 2019-02-02 DIAGNOSIS — S42202A Unspecified fracture of upper end of left humerus, initial encounter for closed fracture: Secondary | ICD-10-CM

## 2019-02-02 LAB — CBC
HCT: 26.6 % — ABNORMAL LOW (ref 36.0–46.0)
HCT: 26.4 % — ABNORMAL LOW (ref 36.0–46.0)
Hemoglobin: 8.9 g/dL — ABNORMAL LOW (ref 12.0–15.0)
Hemoglobin: 8.5 g/dL — ABNORMAL LOW (ref 12.0–15.0)
MCH: 27.7 pg (ref 26.0–34.0)
MCH: 27.2 pg (ref 26.0–34.0)
MCHC: 33.5 g/dL (ref 30.0–36.0)
MCHC: 32.2 g/dL (ref 30.0–36.0)
MCV: 82.9 fL (ref 80.0–100.0)
MCV: 84.6 fL (ref 80.0–100.0)
Platelets: 135 10*3/uL — ABNORMAL LOW (ref 150–400)
Platelets: 135 10*3/uL — ABNORMAL LOW (ref 150–400)
RBC: 3.21 MIL/uL — ABNORMAL LOW (ref 3.87–5.11)
RBC: 3.12 MIL/uL — ABNORMAL LOW (ref 3.87–5.11)
RDW: 17.4 % — ABNORMAL HIGH (ref 11.5–15.5)
RDW: 17.5 % — ABNORMAL HIGH (ref 11.5–15.5)
WBC: 6.4 10*3/uL (ref 4.0–10.5)
WBC: 5.8 10*3/uL (ref 4.0–10.5)
nRBC: 0 % (ref 0.0–0.2)
nRBC: 0 % (ref 0.0–0.2)

## 2019-02-02 LAB — BASIC METABOLIC PANEL
Anion gap: 6 (ref 5–15)
Anion gap: 3 — ABNORMAL LOW (ref 5–15)
BUN: 8 mg/dL (ref 6–20)
BUN: 9 mg/dL (ref 6–20)
CO2: 19 mmol/L — ABNORMAL LOW (ref 22–32)
CO2: 20 mmol/L — ABNORMAL LOW (ref 22–32)
Calcium: 7.9 mg/dL — ABNORMAL LOW (ref 8.9–10.3)
Calcium: 7.8 mg/dL — ABNORMAL LOW (ref 8.9–10.3)
Chloride: 115 mmol/L — ABNORMAL HIGH (ref 98–111)
Chloride: 116 mmol/L — ABNORMAL HIGH (ref 98–111)
Creatinine, Ser: 0.35 mg/dL — ABNORMAL LOW (ref 0.44–1.00)
Creatinine, Ser: 0.37 mg/dL — ABNORMAL LOW (ref 0.44–1.00)
GFR calc Af Amer: >60 mL/min (ref >60)
GFR calc Af Amer: >60 mL/min (ref >60)
GFR calc non Af Amer: >60 mL/min (ref >60)
GFR calc non Af Amer: >60 mL/min (ref >60)
Glucose, Bld: 124 mg/dL — ABNORMAL HIGH (ref 70–99)
Glucose, Bld: 138 mg/dL — ABNORMAL HIGH (ref 70–99)
Potassium: 3.6 mmol/L (ref 3.5–5.1)
Potassium: 3.5 mmol/L (ref 3.5–5.1)
Sodium: 140 mmol/L (ref 135–145)
Sodium: 139 mmol/L (ref 135–145)

## 2019-02-02 LAB — GLUCOSE, CAPILLARY
Glucose-Capillary: 123 mg/dL — ABNORMAL HIGH (ref 70–99)
Glucose-Capillary: 145 mg/dL — ABNORMAL HIGH (ref 70–99)
Glucose-Capillary: 118 mg/dL — ABNORMAL HIGH (ref 70–99)
Glucose-Capillary: 130 mg/dL — ABNORMAL HIGH (ref 70–99)
Glucose-Capillary: 112 mg/dL — ABNORMAL HIGH (ref 70–99)
Glucose-Capillary: 116 mg/dL — ABNORMAL HIGH (ref 70–99)

## 2019-02-02 LAB — BPAM RBC
Blood Product Expiration Date: 202102082359
Blood Product Expiration Date: 202102082359
Blood Product Expiration Date: 202102082359
Blood Product Expiration Date: 202102082359
Blood Product Expiration Date: 202102102359
ISSUE DATE / TIME: 202101062114
ISSUE DATE / TIME: 202101062114
ISSUE DATE / TIME: 202101062114
ISSUE DATE / TIME: 202101062114
ISSUE DATE / TIME: 202101091143
Unit Type and Rh: 5100
Unit Type and Rh: 5100
Unit Type and Rh: 5100
Unit Type and Rh: 5100
Unit Type and Rh: 5100

## 2019-02-02 LAB — TYPE AND SCREEN
ABO/RH(D): O POS
Antibody Screen: NEGATIVE
Unit division: 0
Unit division: 0
Unit division: 0
Unit division: 0
Unit division: 0

## 2019-02-02 LAB — MAGNESIUM: Magnesium: 1.8 mg/dL (ref 1.7–2.4)

## 2019-02-02 LAB — PHOSPHORUS: Phosphorus: 2.4 mg/dL — ABNORMAL LOW (ref 2.5–4.6)

## 2019-02-02 MED ORDER — LACTATED RINGERS IV SOLN: INTRAVENOUS | Status: DC

## 2019-02-02 MED ORDER — MAGNESIUM SULFATE 2 GM/50ML IV SOLN
2.0000 g | Freq: Once | INTRAVENOUS | Status: AC
  Administered 2019-02-02: 2 g via INTRAVENOUS
  Filled 2019-02-02: qty 50

## 2019-02-02 MED ORDER — LEVETIRACETAM 500 MG PO TABS
1000.0000 mg | ORAL_TABLET | Freq: Two times a day (BID) | ORAL | Status: DC
  Administered 2019-02-02 – 2019-02-04 (×3): 1000 mg via ORAL
  Filled 2019-02-02 (×4): qty 2

## 2019-02-02 MED ORDER — POTASSIUM PHOSPHATES 15 MMOLE/5ML IV SOLN
20.0000 mmol | Freq: Once | INTRAVENOUS | Status: AC
  Administered 2019-02-02: 20 mmol via INTRAVENOUS
  Filled 2019-02-02: qty 6.67

## 2019-02-02 MED ORDER — QUETIAPINE FUMARATE 100 MG PO TABS
100.0000 mg | ORAL_TABLET | Freq: Two times a day (BID) | ORAL | Status: DC
  Administered 2019-02-02 – 2019-02-03 (×2): 100 mg
  Filled 2019-02-02 (×2): qty 1

## 2019-02-02 NOTE — Progress Notes (Signed)
Patient requested to have her father be the one visitor, instead of her mom. Later in the day, she had doubts about changing it and couldn't make up her mind. After speaking with her in length and informing her that if we changed it, she couldn't make any further changes. Patient verbalized that it was okay for her father to be the visitor, since her mother has a hard time getting transportation to the hospital. Change approved by Emelia Salisbury AD. Duane Allerton to be the only visitor. Mother agreed to utilize tele visits.

## 2019-02-02 NOTE — Progress Notes (Signed)
Assisted mom with camera/video time via elink
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
## 2019-02-02 NOTE — Progress Notes (Signed)
Assisted tele visit to patient with family member.  Aviraj Kentner P, RN  

## 2019-02-02 NOTE — Progress Notes (Signed)
  NEUROSURGERY PROGRESS NOTE   No issues overnight.  EXAM:  BP (!) 109/59   Pulse 88   Temp 98.6 F (37 C) (Oral)   Resp (!) 31   Ht 5\' 3"  (1.6 m)   Wt 62.8 kg   SpO2 100%   BMI 24.53 kg/m   Awake, alert, oriented  Speech fluent, appropriate  CN grossly intact  4/5 RUE, RLE ex-fix Moves LUE/LLE well Drain in place, wound c/d/i  IMPRESSION:  24 y.o. female POD#4 s/p left craniectomy for SDH, neurologically stable  PLAN: - d/c drain this am - Cont mgmt per 21
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
## 2019-02-02 NOTE — Progress Notes (Signed)
Father, Susan Wilkinson, asked to be called in the morning with an ETA for the surgery and how long it will take.

## 2019-02-03 ENCOUNTER — Encounter (HOSPITAL_COMMUNITY): Admission: EM | Disposition: A | Discharge status: Rehabilitation Facility | Payer: Self-pay | Source: Home / Self Care

## 2019-02-03 ENCOUNTER — Inpatient Hospital Stay (HOSPITAL_COMMUNITY): Payer: No Typology Code available for payment source

## 2019-02-03 ENCOUNTER — Encounter (HOSPITAL_COMMUNITY): Payer: Self-pay

## 2019-02-03 ENCOUNTER — Inpatient Hospital Stay (HOSPITAL_COMMUNITY): Payer: No Typology Code available for payment source | Admitting: Anesthesiology

## 2019-02-03 HISTORY — PX: ORIF HUMERUS FRACTURE: SHX2126

## 2019-02-03 HISTORY — PX: ORIF FEMUR FRACTURE: SHX2119

## 2019-02-03 LAB — CBC
HCT: 28.6 % — ABNORMAL LOW (ref 36.0–46.0)
Hemoglobin: 9.6 g/dL — ABNORMAL LOW (ref 12.0–15.0)
MCH: 27.9 pg (ref 26.0–34.0)
MCHC: 33.6 g/dL (ref 30.0–36.0)
MCV: 83.1 fL (ref 80.0–100.0)
Platelets: 167 10*3/uL (ref 150–400)
RBC: 3.44 MIL/uL — ABNORMAL LOW (ref 3.87–5.11)
RDW: 17.1 % — ABNORMAL HIGH (ref 11.5–15.5)
WBC: 5.8 10*3/uL (ref 4.0–10.5)
nRBC: 0 % (ref 0.0–0.2)

## 2019-02-03 LAB — MAGNESIUM: Magnesium: 2 mg/dL (ref 1.7–2.4)

## 2019-02-03 LAB — BASIC METABOLIC PANEL
Anion gap: 8 (ref 5–15)
BUN: 7 mg/dL (ref 6–20)
CO2: 20 mmol/L — ABNORMAL LOW (ref 22–32)
Calcium: 8.4 mg/dL — ABNORMAL LOW (ref 8.9–10.3)
Chloride: 106 mmol/L (ref 98–111)
Creatinine, Ser: 0.37 mg/dL — ABNORMAL LOW (ref 0.44–1.00)
GFR calc Af Amer: >60 mL/min (ref >60)
GFR calc non Af Amer: >60 mL/min (ref >60)
Glucose, Bld: 122 mg/dL — ABNORMAL HIGH (ref 70–99)
Potassium: 4 mmol/L (ref 3.5–5.1)
Sodium: 134 mmol/L — ABNORMAL LOW (ref 135–145)

## 2019-02-03 LAB — GLUCOSE, CAPILLARY
Glucose-Capillary: 97 mg/dL (ref 70–99)
Glucose-Capillary: 103 mg/dL — ABNORMAL HIGH (ref 70–99)
Glucose-Capillary: 132 mg/dL — ABNORMAL HIGH (ref 70–99)
Glucose-Capillary: 127 mg/dL — ABNORMAL HIGH (ref 70–99)

## 2019-02-03 LAB — PHOSPHORUS: Phosphorus: 3 mg/dL (ref 2.5–4.6)

## 2019-02-03 LAB — PREPARE RBC (CROSSMATCH)

## 2019-02-03 SURGERY — OPEN REDUCTION INTERNAL FIXATION (ORIF) DISTAL FEMUR FRACTURE
Anesthesia: General | Laterality: Right

## 2019-02-03 MED ORDER — 0.9 % SODIUM CHLORIDE (POUR BTL) OPTIME
TOPICAL | Status: DC | PRN
  Administered 2019-02-03: 1000 mL

## 2019-02-03 MED ORDER — LIDOCAINE 2% (20 MG/ML) 5 ML SYRINGE
INTRAMUSCULAR | Status: AC
  Filled 2019-02-03: qty 5

## 2019-02-03 MED ORDER — MORPHINE SULFATE (PF) 4 MG/ML IV SOLN
4.0000 mg | INTRAVENOUS | Status: DC | PRN
  Administered 2019-02-03 – 2019-02-07 (×17): 4 mg via INTRAVENOUS
  Filled 2019-02-03 (×18): qty 1

## 2019-02-03 MED ORDER — SODIUM CHLORIDE 0.9% IV SOLUTION: Freq: Once | INTRAVENOUS | Status: DC

## 2019-02-03 MED ORDER — FENTANYL CITRATE (PF) 250 MCG/5ML IJ SOLN
INTRAMUSCULAR | Status: AC
  Filled 2019-02-03: qty 5

## 2019-02-03 MED ORDER — LACTATED RINGERS IV SOLN: INTRAVENOUS | Status: DC | PRN

## 2019-02-03 MED ORDER — MIDAZOLAM HCL 2 MG/2ML IJ SOLN
INTRAMUSCULAR | Status: AC
  Filled 2019-02-03: qty 2

## 2019-02-03 MED ORDER — CEFAZOLIN SODIUM-DEXTROSE 2-4 GM/100ML-% IV SOLN
INTRAVENOUS | Status: AC
  Filled 2019-02-03: qty 100

## 2019-02-03 MED ORDER — CEFAZOLIN SODIUM-DEXTROSE 2-3 GM-%(50ML) IV SOLR
INTRAVENOUS | Status: DC | PRN
  Administered 2019-02-03 (×2): 2 g via INTRAVENOUS

## 2019-02-03 MED ORDER — FENTANYL CITRATE (PF) 100 MCG/2ML IJ SOLN
INTRAMUSCULAR | Status: AC
  Filled 2019-02-03: qty 2

## 2019-02-03 MED ORDER — VANCOMYCIN HCL 1000 MG IV SOLR
INTRAVENOUS | Status: AC
  Filled 2019-02-03: qty 1000

## 2019-02-03 MED ORDER — DEXAMETHASONE SODIUM PHOSPHATE 10 MG/ML IJ SOLN
INTRAMUSCULAR | Status: DC | PRN
  Administered 2019-02-03: 4 mg via INTRAVENOUS

## 2019-02-03 MED ORDER — VANCOMYCIN HCL 1000 MG IV SOLR
INTRAVENOUS | Status: DC | PRN
  Administered 2019-02-03: 1000 mg

## 2019-02-03 MED ORDER — ROCURONIUM BROMIDE 10 MG/ML (PF) SYRINGE
PREFILLED_SYRINGE | INTRAVENOUS | Status: AC
  Filled 2019-02-03: qty 10

## 2019-02-03 MED ORDER — LIDOCAINE 2% (20 MG/ML) 5 ML SYRINGE
INTRAMUSCULAR | Status: DC | PRN
  Administered 2019-02-03: 30 mg via INTRAVENOUS
  Administered 2019-02-03: 70 mg via INTRAVENOUS

## 2019-02-03 MED ORDER — FENTANYL CITRATE (PF) 100 MCG/2ML IJ SOLN
25.0000 ug | INTRAMUSCULAR | Status: DC | PRN
  Administered 2019-02-03 (×2): 50 ug via INTRAVENOUS

## 2019-02-03 MED ORDER — ACETAMINOPHEN 10 MG/ML IV SOLN
INTRAVENOUS | Status: DC | PRN
  Administered 2019-02-03: 1000 mg via INTRAVENOUS

## 2019-02-03 MED ORDER — TOBRAMYCIN SULFATE 1.2 G IJ SOLR
INTRAMUSCULAR | Status: DC | PRN
  Administered 2019-02-03: 1.2 g

## 2019-02-03 MED ORDER — FENTANYL CITRATE (PF) 100 MCG/2ML IJ SOLN
INTRAMUSCULAR | Status: DC | PRN
  Administered 2019-02-03: 25 ug via INTRAVENOUS
  Administered 2019-02-03: 150 ug via INTRAVENOUS
  Administered 2019-02-03: 50 ug via INTRAVENOUS
  Administered 2019-02-03: 25 ug via INTRAVENOUS
  Administered 2019-02-03: 100 ug via INTRAVENOUS
  Administered 2019-02-03 (×2): 50 ug via INTRAVENOUS
  Administered 2019-02-03 (×2): 100 ug via INTRAVENOUS
  Administered 2019-02-03 (×2): 50 ug via INTRAVENOUS

## 2019-02-03 MED ORDER — DEXMEDETOMIDINE HCL 200 MCG/2ML IV SOLN
INTRAVENOUS | Status: DC | PRN
  Administered 2019-02-03 (×2): 4 ug via INTRAVENOUS
  Administered 2019-02-03: 8 ug via INTRAVENOUS
  Administered 2019-02-03: 4 ug via INTRAVENOUS

## 2019-02-03 MED ORDER — HYDROMORPHONE HCL 1 MG/ML IJ SOLN
INTRAMUSCULAR | Status: AC
  Filled 2019-02-03: qty 0.5

## 2019-02-03 MED ORDER — ROCURONIUM BROMIDE 10 MG/ML (PF) SYRINGE
PREFILLED_SYRINGE | INTRAVENOUS | Status: DC | PRN
  Administered 2019-02-03: 40 mg via INTRAVENOUS
  Administered 2019-02-03: 20 mg via INTRAVENOUS
  Administered 2019-02-03 (×4): 30 mg via INTRAVENOUS
  Administered 2019-02-03: 20 mg via INTRAVENOUS

## 2019-02-03 MED ORDER — PROPOFOL 10 MG/ML IV BOLUS
INTRAVENOUS | Status: DC | PRN
  Administered 2019-02-03: 130 mg via INTRAVENOUS

## 2019-02-03 MED ORDER — METHYLENE BLUE 0.5 % INJ SOLN
INTRAVENOUS | Status: AC
  Filled 2019-02-03: qty 10

## 2019-02-03 MED ORDER — ACETAMINOPHEN 10 MG/ML IV SOLN
INTRAVENOUS | Status: AC
  Filled 2019-02-03: qty 100

## 2019-02-03 MED ORDER — TOBRAMYCIN SULFATE 1.2 G IJ SOLR
INTRAMUSCULAR | Status: AC
  Filled 2019-02-03: qty 1.2

## 2019-02-03 MED ORDER — SUGAMMADEX SODIUM 200 MG/2ML IV SOLN
INTRAVENOUS | Status: DC | PRN
  Administered 2019-02-03: 150 mg via INTRAVENOUS

## 2019-02-03 MED ORDER — HYDROMORPHONE HCL 1 MG/ML IJ SOLN
INTRAMUSCULAR | Status: DC | PRN
  Administered 2019-02-03 (×2): .5 mg via INTRAVENOUS

## 2019-02-03 MED ORDER — SUCCINYLCHOLINE CHLORIDE 200 MG/10ML IV SOSY
PREFILLED_SYRINGE | INTRAVENOUS | Status: DC | PRN
  Administered 2019-02-03: 120 mg via INTRAVENOUS

## 2019-02-03 MED ORDER — PROPOFOL 10 MG/ML IV BOLUS
INTRAVENOUS | Status: AC
  Filled 2019-02-03: qty 20

## 2019-02-03 MED ORDER — ONDANSETRON HCL 4 MG/2ML IJ SOLN: 4.0000 mg | Freq: Once | INTRAMUSCULAR | Status: DC | PRN

## 2019-02-03 MED ORDER — MIDAZOLAM HCL 5 MG/5ML IJ SOLN
INTRAMUSCULAR | Status: DC | PRN
  Administered 2019-02-03: 2 mg via INTRAVENOUS

## 2019-02-03 SURGICAL SUPPLY — 110 items
BIT DRILL 100X2.5XANTM LCK (BIT) ×2 IMPLANT
BIT DRILL 3.2 (BIT) ×1
BIT DRILL 3.2XCALB NS DISP (BIT) ×2 IMPLANT
BIT DRILL 4.3 (BIT) ×5 IMPLANT
BIT DRILL 4.3X300MM (BIT) ×2 IMPLANT
BIT DRILL CALIBRATED 2.7 (BIT) ×3 IMPLANT
BIT DRILL LONG 3.3 (BIT) ×6 IMPLANT
BIT DRILL QC 3.3X195 (BIT) ×3 IMPLANT
BIT DRL 100X2.5XANTM LCK (BIT) ×2
BIT DRL 3.2XCALB NS DISP (BIT) ×2
BNDG COHESIVE 4X5 TAN STRL (GAUZE/BANDAGES/DRESSINGS) IMPLANT
BNDG COHESIVE 6X5 TAN STRL LF (GAUZE/BANDAGES/DRESSINGS) ×3 IMPLANT
BNDG ELASTIC 6X10 VLCR STRL LF (GAUZE/BANDAGES/DRESSINGS) ×3 IMPLANT
BOWL SMART MIX CTS (DISPOSABLE) ×3 IMPLANT
BRUSH SCRUB EZ PLAIN DRY (MISCELLANEOUS) ×6 IMPLANT
CANISTER SUCT 3000ML PPV (MISCELLANEOUS) ×3 IMPLANT
CANISTER WOUND CARE 500ML ATS (WOUND CARE) ×3 IMPLANT
CAP LOCK NCB (Cap) ×30 IMPLANT
CEMENT BONE DEPUY (Cement) ×3 IMPLANT
CHLORAPREP W/TINT 26 (MISCELLANEOUS) ×12 IMPLANT
COVER SURGICAL LIGHT HANDLE (MISCELLANEOUS) ×6 IMPLANT
DERMABOND ADHESIVE PROPEN (GAUZE/BANDAGES/DRESSINGS) ×1
DERMABOND ADVANCED (GAUZE/BANDAGES/DRESSINGS)
DERMABOND ADVANCED .7 DNX12 (GAUZE/BANDAGES/DRESSINGS) IMPLANT
DERMABOND ADVANCED .7 DNX6 (GAUZE/BANDAGES/DRESSINGS) ×2 IMPLANT
DRAPE C-ARM 42X72 X-RAY (DRAPES) ×3 IMPLANT
DRAPE C-ARMOR (DRAPES) ×6 IMPLANT
DRAPE HALF SHEET 40X57 (DRAPES) ×12 IMPLANT
DRAPE INCISE IOBAN 66X45 STRL (DRAPES) ×3 IMPLANT
DRAPE ORTHO SPLIT 77X108 STRL (DRAPES) ×4
DRAPE SURG 17X23 STRL (DRAPES) ×3 IMPLANT
DRAPE SURG ORHT 6 SPLT 77X108 (DRAPES) ×8 IMPLANT
DRAPE U-SHAPE 47X51 STRL (DRAPES) ×9 IMPLANT
DRILL BIT 2.5MM (BIT) ×1
DRILL BIT 4.3 (BIT) ×1
DRSG MEPILEX BORDER 4X12 (GAUZE/BANDAGES/DRESSINGS) IMPLANT
DRSG MEPILEX BORDER 4X4 (GAUZE/BANDAGES/DRESSINGS) ×6 IMPLANT
DRSG MEPILEX BORDER 4X8 (GAUZE/BANDAGES/DRESSINGS) ×3 IMPLANT
DRSG PAD ABDOMINAL 8X10 ST (GAUZE/BANDAGES/DRESSINGS) ×9 IMPLANT
ELECT REM PT RETURN 9FT ADLT (ELECTROSURGICAL) ×3
ELECTRODE REM PT RTRN 9FT ADLT (ELECTROSURGICAL) ×2 IMPLANT
GAUZE SPONGE 4X4 12PLY STRL (GAUZE/BANDAGES/DRESSINGS) ×3 IMPLANT
GLOVE BIO SURGEON STRL SZ 6.5 (GLOVE) ×9 IMPLANT
GLOVE BIO SURGEON STRL SZ7.5 (GLOVE) ×12 IMPLANT
GLOVE BIOGEL PI IND STRL 6.5 (GLOVE) ×2 IMPLANT
GLOVE BIOGEL PI IND STRL 7.5 (GLOVE) ×2 IMPLANT
GLOVE BIOGEL PI INDICATOR 6.5 (GLOVE) ×1
GLOVE BIOGEL PI INDICATOR 7.5 (GLOVE) ×1
GOWN STRL REUS W/ TWL LRG LVL3 (GOWN DISPOSABLE) ×12 IMPLANT
GOWN STRL REUS W/TWL LRG LVL3 (GOWN DISPOSABLE) ×6
HALF PIN 5.0X160 (EXFIX) ×3 IMPLANT
HANDPIECE INTERPULSE COAX TIP (DISPOSABLE) ×1
K-WIRE 2.0 (WIRE) ×3
K-WIRE 2X5 SS THRDED S3 (WIRE) ×6
K-WIRE FXSTD 280X2XNS SS (WIRE) ×6
KIT BASIN OR (CUSTOM PROCEDURE TRAY) ×3 IMPLANT
KIT DRSG PREVENA PLUS 7DAY 125 (MISCELLANEOUS) ×3 IMPLANT
KIT TURNOVER KIT B (KITS) ×3 IMPLANT
KWIRE 2X5 SS THRDED S3 (WIRE) ×4 IMPLANT
KWIRE FXSTD 280X2XNS SS (WIRE) ×6 IMPLANT
MANIFOLD NEPTUNE II (INSTRUMENTS) ×3 IMPLANT
NEEDLE HYPO 25GX1X1/2 BEV (NEEDLE) IMPLANT
NS IRRIG 1000ML POUR BTL (IV SOLUTION) ×6 IMPLANT
PACK GENERAL/GYN (CUSTOM PROCEDURE TRAY) ×3 IMPLANT
PACK TOTAL JOINT (CUSTOM PROCEDURE TRAY) ×6 IMPLANT
PAD ARMBOARD 7.5X6 YLW CONV (MISCELLANEOUS) ×3 IMPLANT
PAD CAST 4YDX4 CTTN HI CHSV (CAST SUPPLIES) ×2 IMPLANT
PADDING CAST COTTON 4X4 STRL (CAST SUPPLIES) ×1
PADDING CAST COTTON 6X4 STRL (CAST SUPPLIES) ×3 IMPLANT
PEG LOCKING 3.2MMX46 (Peg) ×3 IMPLANT
PEG LOCKING 3.2X36 (Screw) ×3 IMPLANT
PEG LOCKING 3.2X40 (Peg) ×3 IMPLANT
PEG LOCKING 3.2X42 (Screw) ×3 IMPLANT
PLATE FEM DIST NCB PP 278MM (Plate) ×3 IMPLANT
PLATE HUMERUS LP PROX L 3H (Plate) ×3 IMPLANT
SCREW 5.0 70MM (Screw) ×9 IMPLANT
SCREW 5.0 80MM (Screw) ×6 IMPLANT
SCREW CORT T15 TPR 55X3.5XST (Screw) ×4 IMPLANT
SCREW CORTICAL 3.5X55MM (Screw) ×2 IMPLANT
SCREW LOCK CORT STAR 3.5X48 (Screw) ×3 IMPLANT
SCREW LP 3.5X44 (Screw) ×3 IMPLANT
SCREW LP 3.5X75MM (Screw) ×3 IMPLANT
SCREW LP NL T15 3.5X22 (Screw) ×3 IMPLANT
SCREW LP NL T15 3.5X24 (Screw) ×9 IMPLANT
SCREW NCB 3.5X75X5X6.2XST (Screw) ×4 IMPLANT
SCREW NCB 4.0MX30M (Screw) ×3 IMPLANT
SCREW NCB 5.0X28MM (Screw) ×3 IMPLANT
SCREW NCB 5.0X30MM (Screw) ×6 IMPLANT
SCREW NCB 5.0X75MM (Screw) ×2 IMPLANT
SET HNDPC FAN SPRY TIP SCT (DISPOSABLE) ×2 IMPLANT
SLEEVE MEASURING 3.2 (BIT) ×6 IMPLANT
SLING ARM IMMOBILIZER LRG (SOFTGOODS) IMPLANT
SLING ARM IMMOBILIZER MED (SOFTGOODS) IMPLANT
SPONGE LAP 18X18 RF (DISPOSABLE) ×6 IMPLANT
STAPLER VISISTAT 35W (STAPLE) ×6 IMPLANT
STOCKINETTE IMPERVIOUS 9X36 MD (GAUZE/BANDAGES/DRESSINGS) ×3 IMPLANT
SUCTION FRAZIER HANDLE 10FR (MISCELLANEOUS) ×2
SUCTION TUBE FRAZIER 10FR DISP (MISCELLANEOUS) ×4 IMPLANT
SUT ETHILON 3 0 PS 1 (SUTURE) ×12 IMPLANT
SUT MNCRL AB 3-0 PS2 27 (SUTURE) ×6 IMPLANT
SUT PDS AB 0 CT 36 (SUTURE) ×3 IMPLANT
SUT VIC AB 0 CT1 27 (SUTURE) ×2
SUT VIC AB 0 CT1 27XBRD ANBCTR (SUTURE) ×4 IMPLANT
SUT VIC AB 1 CT1 27 (SUTURE) ×1
SUT VIC AB 1 CT1 27XBRD ANBCTR (SUTURE) ×2 IMPLANT
SUT VIC AB 2-0 CT1 27 (SUTURE) ×2
SUT VIC AB 2-0 CT1 TAPERPNT 27 (SUTURE) ×4 IMPLANT
SUT VIC AB 2-0 CT2 27 (SUTURE) ×3 IMPLANT
TOWEL GREEN STERILE (TOWEL DISPOSABLE) ×6 IMPLANT
TRAY FOLEY MTR SLVR 16FR STAT (SET/KITS/TRAYS/PACK) ×3 IMPLANT

## 2019-02-03 NOTE — Progress Notes (Signed)
SLP Cancellation Note  Patient Details Name: Khadejah Son MRN: [id number] DOB: 1995-08-24   Cancelled treatment:       Reason Eval/Treat Not Completed: Patient at procedure or test/unavailable (in OR today). Will f/u as able.    Mahala Menghini., M.A. CCC-SLP Acute Rehabilitation Services Pager [phone] Office [phone]  02/03/2019, 10:31 AM

## 2019-02-03 NOTE — Anesthesia Preprocedure Evaluation (Signed)

## 2019-02-03 NOTE — Anesthesia Procedure Notes (Signed)
Procedure Name: Intubation Date/Time: 02/03/2019 10:54 AM Performed by: Moshe Salisbury, CRNA Pre-anesthesia Checklist: Patient identified, Emergency Drugs available, Suction available and Patient being monitored Patient Re-evaluated:Patient Re-evaluated prior to induction Oxygen Delivery Method: Circle System Utilized Preoxygenation: Pre-oxygenation with 100% oxygen Induction Type: IV induction Ventilation: Mask ventilation without difficulty Laryngoscope Size: Mac and 3 Grade View: Grade I Tube type: Oral Number of attempts: 1 Airway Equipment and Method: Stylet Placement Confirmation: ETT inserted through vocal cords under direct vision,  positive ETCO2 and breath sounds checked- equal and bilateral Secured at: 21 cm Tube secured with: Tape Dental Injury: Teeth and Oropharynx as per pre-operative assessment
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
## 2019-02-03 NOTE — Transfer of Care (Signed)
Immediate Anesthesia Transfer of Care Note  Patient: Susan Wilkinson  Procedure(s) Performed: OPEN REDUCTION INTERNAL FIXATION (ORIF) DISTAL FEMUR FRACTURE (Right ) OPEN REDUCTION INTERNAL FIXATION (ORIF) PROXIMAL HUMERUS FRACTURE (Left )  Patient Location: PACU  Anesthesia Type:General  Level of Consciousness: awake  Airway & Oxygen Therapy: Patient Spontanous Breathing  Post-op Assessment: Report given to RN and Post -op Vital signs reviewed and stable  Post vital signs: Reviewed and stable  Last Vitals:  Vitals Value Taken Time  BP 144/80 02/03/19 1654  Temp    Pulse 96 02/03/19 1655  Resp 20 02/03/19 1655  SpO2 100 % 02/03/19 1655  Vitals shown include unvalidated device data.  Last Pain:  Vitals:   02/03/19 0400  TempSrc: Oral  PainSc: Asleep         Complications: No apparent anesthesia complications

## 2019-02-03 NOTE — Anesthesia Procedure Notes (Signed)
Central Venous Catheter Insertion Performed by: Kipp Brood, MD, anesthesiologist Start/End1/11/2019 11:00 AM, 02/03/2019 11:10 AM Patient location: Pre-op. Preanesthetic checklist: patient identified, IV checked, site marked, risks and benefits discussed, surgical consent, monitors and equipment checked, pre-op evaluation, timeout performed and anesthesia consent Lidocaine 1% used for infiltration and patient sedated Hand hygiene performed  and maximum sterile barriers used  Catheter size: 8 Fr Total catheter length 16. Central line was placed.Double lumen Procedure performed using ultrasound guided technique. Ultrasound Notes:image(s) printed for medical record Attempts: 1 Following insertion, dressing applied and line sutured. Post procedure assessment: blood return through all ports  Patient tolerated the procedure well with no immediate complications. Additional procedure comments: L. External jugular double lumen central line inserted. Unable to cannulate L.IJ X 2, decision made to proceed to external jugular, easy cannulation, wire threaded without difficulty.

## 2019-02-03 NOTE — Progress Notes (Signed)
Ortho Trauma Progress Note  Plan to proceed with removal of external fixator with open reduction internal fixation of right distal femur fracture.  We will also plan to proceed with open reduction internal fixation of right clavicle and possible open reduction internal fixation of left proximal humerus.  Risks and benefits were discussed with the patient and her father.  She will need a second procedure for a bone graft as she has a significant bone loss to her distal femur.  All questions were answered and consent was obtained.  Roby Lofts, MD [facility] [phone] (office) [email or web address]

## 2019-02-04 ENCOUNTER — Inpatient Hospital Stay (HOSPITAL_COMMUNITY): Payer: No Typology Code available for payment source

## 2019-02-04 ENCOUNTER — Encounter: Payer: Self-pay | Admitting: *Deleted

## 2019-02-04 ENCOUNTER — Inpatient Hospital Stay: Payer: Self-pay

## 2019-02-04 DIAGNOSIS — R569 Unspecified convulsions: Secondary | ICD-10-CM

## 2019-02-04 DIAGNOSIS — G40901 Epilepsy, unspecified, not intractable, with status epilepticus: Secondary | ICD-10-CM

## 2019-02-04 LAB — GLUCOSE, CAPILLARY
Glucose-Capillary: 109 mg/dL — ABNORMAL HIGH (ref 70–99)
Glucose-Capillary: 117 mg/dL — ABNORMAL HIGH (ref 70–99)
Glucose-Capillary: 137 mg/dL — ABNORMAL HIGH (ref 70–99)
Glucose-Capillary: 104 mg/dL — ABNORMAL HIGH (ref 70–99)
Glucose-Capillary: 120 mg/dL — ABNORMAL HIGH (ref 70–99)
Glucose-Capillary: 98 mg/dL (ref 70–99)

## 2019-02-04 LAB — POCT I-STAT 7, (LYTES, BLD GAS, ICA,H+H)
Acid-Base Excess: 2 mmol/L (ref 0.0–2.0)
Acid-base deficit: 3 mmol/L — ABNORMAL HIGH (ref 0.0–2.0)
Bicarbonate: 20.6 mmol/L (ref 20.0–28.0)
Bicarbonate: 24.8 mmol/L (ref 20.0–28.0)
Calcium, Ion: 1.14 mmol/L — ABNORMAL LOW (ref 1.15–1.40)
Calcium, Ion: 1.06 mmol/L — ABNORMAL LOW (ref 1.15–1.40)
HCT: 26 % — ABNORMAL LOW (ref 36.0–46.0)
HCT: 34 % — ABNORMAL LOW (ref 36.0–46.0)
Hemoglobin: 8.8 g/dL — ABNORMAL LOW (ref 12.0–15.0)
Hemoglobin: 11.6 g/dL — ABNORMAL LOW (ref 12.0–15.0)
O2 Saturation: 99 %
O2 Saturation: 100 %
Patient temperature: 37
Patient temperature: 98
Potassium: 4.4 mmol/L (ref 3.5–5.1)
Potassium: 4 mmol/L (ref 3.5–5.1)
Sodium: 132 mmol/L — ABNORMAL LOW (ref 135–145)
Sodium: 126 mmol/L — ABNORMAL LOW (ref 135–145)
TCO2: 21 mmol/L — ABNORMAL LOW (ref 22–32)
TCO2: 26 mmol/L (ref 22–32)
pCO2 arterial: 29.4 mmHg — ABNORMAL LOW (ref 32.0–48.0)
pCO2 arterial: 33.2 mmHg (ref 32.0–48.0)
pH, Arterial: 7.452 — ABNORMAL HIGH (ref 7.350–7.450)
pH, Arterial: 7.48 — ABNORMAL HIGH (ref 7.350–7.450)
pO2, Arterial: 133 mmHg — ABNORMAL HIGH (ref 83.0–108.0)
pO2, Arterial: 496 mmHg — ABNORMAL HIGH (ref 83.0–108.0)

## 2019-02-04 LAB — OSMOLALITY: Osmolality: 254 mOsm/kg — ABNORMAL LOW (ref 275–295)

## 2019-02-04 LAB — CBC
HCT: 22.6 % — ABNORMAL LOW (ref 36.0–46.0)
Hemoglobin: 7.6 g/dL — ABNORMAL LOW (ref 12.0–15.0)
MCH: 27.6 pg (ref 26.0–34.0)
MCHC: 33.6 g/dL (ref 30.0–36.0)
MCV: 82.2 fL (ref 80.0–100.0)
Platelets: 240 10*3/uL (ref 150–400)
RBC: 2.75 MIL/uL — ABNORMAL LOW (ref 3.87–5.11)
RDW: 17 % — ABNORMAL HIGH (ref 11.5–15.5)
WBC: 10.1 10*3/uL (ref 4.0–10.5)
nRBC: 0 % (ref 0.0–0.2)

## 2019-02-04 LAB — TRIGLYCERIDES: Triglycerides: 98 mg/dL (ref <150)

## 2019-02-04 LAB — BASIC METABOLIC PANEL
Anion gap: 9 (ref 5–15)
BUN: 8 mg/dL (ref 6–20)
CO2: 22 mmol/L (ref 22–32)
Calcium: 8.1 mg/dL — ABNORMAL LOW (ref 8.9–10.3)
Chloride: 93 mmol/L — ABNORMAL LOW (ref 98–111)
Creatinine, Ser: 0.46 mg/dL (ref 0.44–1.00)
GFR calc Af Amer: >60 mL/min (ref >60)
GFR calc non Af Amer: >60 mL/min (ref >60)
Glucose, Bld: 168 mg/dL — ABNORMAL HIGH (ref 70–99)
Potassium: 3.7 mmol/L (ref 3.5–5.1)
Sodium: 124 mmol/L — ABNORMAL LOW (ref 135–145)

## 2019-02-04 LAB — SODIUM
Sodium: 122 mmol/L — ABNORMAL LOW (ref 135–145)
Sodium: 124 mmol/L — ABNORMAL LOW (ref 135–145)
Sodium: 123 mmol/L — ABNORMAL LOW (ref 135–145)
Sodium: 125 mmol/L — ABNORMAL LOW (ref 135–145)

## 2019-02-04 MED ORDER — LEVETIRACETAM IN NACL 1000 MG/100ML IV SOLN
1000.0000 mg | Freq: Once | INTRAVENOUS | Status: AC
  Administered 2019-02-04: 1000 mg via INTRAVENOUS

## 2019-02-04 MED ORDER — LEVETIRACETAM IN NACL 1000 MG/100ML IV SOLN: 1000.0000 mg | Freq: Once | INTRAVENOUS | Status: DC

## 2019-02-04 MED ORDER — FENTANYL 2500MCG IN NS 250ML (10MCG/ML) PREMIX INFUSION
0.0000 ug/h | INTRAVENOUS | Status: DC
  Administered 2019-02-04: 50 ug/h via INTRAVENOUS
  Administered 2019-02-05: 200 ug/h via INTRAVENOUS
  Filled 2019-02-04 (×2): qty 250

## 2019-02-04 MED ORDER — TRAMADOL HCL 50 MG PO TABS
50.0000 mg | ORAL_TABLET | Freq: Four times a day (QID) | ORAL | Status: DC
  Administered 2019-02-04: 50 mg via ORAL
  Filled 2019-02-04: qty 1

## 2019-02-04 MED ORDER — PROPOFOL 1000 MG/100ML IV EMUL
INTRAVENOUS | Status: AC
  Filled 2019-02-04: qty 100

## 2019-02-04 MED ORDER — ROCURONIUM BROMIDE 50 MG/5ML IV SOLN
50.0000 mg | Freq: Once | INTRAVENOUS | Status: AC
  Administered 2019-02-04: 50 mg via INTRAVENOUS

## 2019-02-04 MED ORDER — OXYCODONE HCL 5 MG PO TABS
5.0000 mg | ORAL_TABLET | ORAL | Status: DC | PRN
  Filled 2019-02-04: qty 2

## 2019-02-04 MED ORDER — SODIUM CHLORIDE 3 % IV SOLN: INTRAVENOUS | Status: DC

## 2019-02-04 MED ORDER — VITAMIN D 25 MCG (1000 UNIT) PO TABS
2000.0000 [IU] | ORAL_TABLET | Freq: Two times a day (BID) | ORAL | Status: DC
  Administered 2019-02-04: 10:00:00 2000 [IU] via ORAL
  Filled 2019-02-04 (×2): qty 2

## 2019-02-04 MED ORDER — LORAZEPAM 2 MG/ML IJ SOLN
6.0000 mg | Freq: Once | INTRAMUSCULAR | Status: AC
  Administered 2019-02-04: 6 mg via INTRAVENOUS

## 2019-02-04 MED ORDER — LORAZEPAM 2 MG/ML IJ SOLN
INTRAMUSCULAR | Status: AC
  Filled 2019-02-04: qty 1

## 2019-02-04 MED ORDER — LEVETIRACETAM 100 MG/ML PO SOLN
1000.0000 mg | Freq: Two times a day (BID) | ORAL | Status: DC
  Administered 2019-02-04 – 2019-02-05 (×2): 1000 mg
  Filled 2019-02-04 (×2): qty 10

## 2019-02-04 MED ORDER — CHLORHEXIDINE GLUCONATE 0.12% ORAL RINSE (MEDLINE KIT)
15.0000 mL | Freq: Two times a day (BID) | OROMUCOSAL | Status: DC
  Administered 2019-02-04 – 2019-02-05 (×2): 15 mL via OROMUCOSAL

## 2019-02-04 MED ORDER — SODIUM CHLORIDE 3 % IV SOLN
INTRAVENOUS | Status: DC
  Administered 2019-02-05 (×2): 75 mL/h via INTRAVENOUS
  Filled 2019-02-04 (×3): qty 500

## 2019-02-04 MED ORDER — ENSURE ENLIVE PO LIQD
237.0000 mL | Freq: Two times a day (BID) | ORAL | Status: DC
  Administered 2019-02-04 – 2019-02-10 (×8): 237 mL via ORAL

## 2019-02-04 MED ORDER — VITAMIN D 25 MCG (1000 UNIT) PO TABS
2000.0000 [IU] | ORAL_TABLET | Freq: Two times a day (BID) | ORAL | Status: DC
  Administered 2019-02-04 – 2019-02-05 (×2): 2000 [IU]
  Filled 2019-02-04: qty 2

## 2019-02-04 MED ORDER — ETOMIDATE 2 MG/ML IV SOLN
20.0000 mg | Freq: Once | INTRAVENOUS | Status: AC
  Administered 2019-02-04: 20 mg via INTRAVENOUS

## 2019-02-04 MED ORDER — SODIUM CHLORIDE 1 G PO TABS
1.0000 g | ORAL_TABLET | Freq: Three times a day (TID) | ORAL | Status: DC
  Administered 2019-02-04 (×2): 1 g via ORAL
  Filled 2019-02-04 (×3): qty 1

## 2019-02-04 MED ORDER — QUETIAPINE FUMARATE 100 MG PO TABS
100.0000 mg | ORAL_TABLET | Freq: Two times a day (BID) | ORAL | Status: DC
  Administered 2019-02-04: 100 mg via ORAL
  Filled 2019-02-04 (×2): qty 1

## 2019-02-04 MED ORDER — METHOCARBAMOL 500 MG PO TABS: 1000.0000 mg | ORAL_TABLET | Freq: Three times a day (TID) | ORAL | Status: DC | PRN

## 2019-02-04 MED ORDER — ORAL CARE MOUTH RINSE
15.0000 mL | OROMUCOSAL | Status: DC
  Administered 2019-02-04 – 2019-02-05 (×8): 15 mL via OROMUCOSAL

## 2019-02-04 MED ORDER — CEFAZOLIN SODIUM-DEXTROSE 2-4 GM/100ML-% IV SOLN
2.0000 g | Freq: Three times a day (TID) | INTRAVENOUS | Status: AC
  Administered 2019-02-04 – 2019-02-05 (×3): 2 g via INTRAVENOUS
  Filled 2019-02-04 (×3): qty 100

## 2019-02-04 MED ORDER — LORAZEPAM 2 MG/ML IJ SOLN
INTRAMUSCULAR | Status: AC
  Filled 2019-02-04: qty 2

## 2019-02-04 MED ORDER — QUETIAPINE FUMARATE 100 MG PO TABS
100.0000 mg | ORAL_TABLET | Freq: Two times a day (BID) | ORAL | Status: DC
  Administered 2019-02-04 – 2019-02-05 (×2): 100 mg
  Filled 2019-02-04: qty 1

## 2019-02-04 MED ORDER — PROPOFOL 1000 MG/100ML IV EMUL
5.0000 ug/kg/min | INTRAVENOUS | Status: DC
  Administered 2019-02-04: 40 ug/kg/min via INTRAVENOUS
  Administered 2019-02-04: 23:00:00 60 ug/kg/min via INTRAVENOUS
  Administered 2019-02-05: 40 ug/kg/min via INTRAVENOUS
  Administered 2019-02-05: 03:00:00 60 ug/kg/min via INTRAVENOUS
  Filled 2019-02-04 (×3): qty 100

## 2019-02-04 MED ORDER — ACETAMINOPHEN 325 MG PO TABS
650.0000 mg | ORAL_TABLET | Freq: Four times a day (QID) | ORAL | Status: DC | PRN
  Administered 2019-02-05 – 2019-02-09 (×3): 650 mg via ORAL
  Filled 2019-02-04 (×3): qty 2

## 2019-02-04 MED ORDER — SODIUM CHLORIDE 3 % IV SOLN
INTRAVENOUS | Status: DC
  Administered 2019-02-04: 62 mL/h via INTRAVENOUS
  Filled 2019-02-04 (×3): qty 500
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
## 2019-02-04 NOTE — Progress Notes (Signed)
Patient returned to 4N RM 18 from CT trip. No issues to report. Patient arterial blood gas completed decreased FI02 based on values.

## 2019-02-04 NOTE — Progress Notes (Signed)
EEG complete - results pending 
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
## 2019-02-04 NOTE — Progress Notes (Signed)
cortrak removed by pt, no obvious distress to pt. Pt has passed swallow screen and has diet order, medication routes will need to be changed to p.o. from per tube. Will pass on to day shift RN.Delfino Lovett, RN, BSN 02/04/2019 6:33 AM
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
## 2019-02-04 NOTE — Progress Notes (Signed)
Called by pt's RN, had what sounds like a GTC now with desats, CCM to place ETT, currently on PPx keppra 1gbid, hyponatremic on 3%, once patient is stabilized, given the new Sz, we should repeat the CT head to make sure there's not a new structural cause, will place the order. Unclear cause of hyponatremia, leviteracetam is less common to induce hyponatremia, either way now is not a good time to pull off an AED. Last Na from 18:00 shows correction of 74mEq/L, but still 124. Pharmacy managing hypertonics, recommend increasing the rate. Recommend starting a second AED if she has another seizure, PHT or VPA. But I suspect that fixing her  hyponatremia will be the best treatment to prevent another seizure.

## 2019-02-04 NOTE — Progress Notes (Signed)
Pt was noted seizing around 6:47pm not sure about the exact time it started prior to that pt was A&Ox4. She was given a total of 6 mg of ativan. She then was not protecting her airway and was changing color, so pt got intubated emergently. CCMD provider was at bedside after being paged for intubation. Trama team and neurosurgery had been notified prior to intubation.  Pt currently has propofol and fentanyl running, and she got 1g of Keppra right after intubation. CT is ordered the incoming nurse has gotten report and will take pt down for a CT scan.

## 2019-02-04 NOTE — Progress Notes (Signed)
Orthopedic Tech Progress Note Patient Details:  Susan Wilkinson 1995/06/10 [id number]  CPM Right Knee CPM Right Knee: On Right Knee Flexion (Degrees): 60 Right Knee Extension (Degrees): 0 Additional Comments: added ice  Post Interventions Patient Tolerated: Well Instructions Provided: Care of device, Adjustment of device  Donald Pore 02/04/2019, 1:25 PM
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
## 2019-02-04 NOTE — Progress Notes (Signed)
Subjective: NAEs o/n  Objective: Vital signs in last 24 hours: Temp:  [98.2 F (36.8 C)-99.2 F (37.3 C)] 99.2 F (37.3 C) (01/12 0800) Pulse Rate:  [84-107] 103 (01/12 1000) Resp:  [19-27] 24 (01/12 1000) BP: (122-157)/(63-97) 157/81 (01/12 1000) SpO2:  [94 %-100 %] 100 % (01/12 1000) Arterial Line BP: (124-144)/(60-75) 124/66 (01/11 1723) Weight:  [62.8 kg] 62.8 kg (01/12 0400)  Intake/Output from previous day: 01/11 0701 - 01/12 0700 In: 3909.3 [I.V.:3809.3; IV Piggyback:100] Out: 1000 [Urine:600; Blood:400] Intake/Output this shift: No intake/output data recorded.  Awake, alert. Eyes open spontaneously, PERRL. FC x 4, mild drift in RUE. Oriented to person, hospital, year. Incision c/d/i.  Flap soft, mildly full.  Lab Results: Recent Labs    02/03/19 0634 02/03/19 1355 02/04/19 0956  WBC 5.8  --  10.1  HGB 9.6* 8.8* 7.6*  HCT 28.6* 26.0* 22.6*  PLT 167  --  240   BMET Recent Labs    02/03/19 0634 02/03/19 1355 02/04/19 0956  NA 134* 132* 124*  K 4.0 4.4 3.7  CL 106  --  93*  CO2 20*  --  22  GLUCOSE 122*  --  168*  BUN 7  --  8  CREATININE 0.37*  --  0.46  CALCIUM 8.4*  --  8.1*    Studies/Results: DG Clavicle Right  Result Date: 02/03/2019 CLINICAL DATA:  ORIF right femur and left humerus fractures. EXAM: RIGHT CLAVICLE - 2+ VIEWS COMPARISON:  None. FINDINGS: Intraoperative spot images of the right clavicle. No visible fracture on these intraoperative spot images. IMPRESSION: No visible fracture. Electronically Signed   By: Rolm Baptise M.D.   On: 02/03/2019 17:38   Left Humerus  Result Date: 02/04/2019 CLINICAL DATA:  Internal fixation of proximal humeral fractures. EXAM: LEFT HUMERUS - 2+ VIEW COMPARISON:  Radiographs 01/29/2019 FINDINGS: There is a lateral sideplate and multiple screws transfixing the humeral neck fracture. Anatomic alignment without complicating features. Linear foreign body noted in the medial soft tissues of the mid arm, possibly  on the patient's skin. Recommend clinical correlation IMPRESSION: Internal fixation of humeral neck fracture with anatomic alignment. Electronically Signed   By: Marijo Sanes M.D.   On: 02/04/2019 09:20   DG Humerus Left  Result Date: 02/03/2019 CLINICAL DATA:  Internal fixation EXAM: LEFT HUMERUS - 2+ VIEW COMPARISON:  01/29/2019 FINDINGS: Multiple intraoperative spot images submitted. These demonstrate plate and screw fixation across the left humeral neck fracture. Anatomic alignment. No complicating feature. IMPRESSION: Internal fixation.  No visible complicating feature. Electronically Signed   By: Rolm Baptise M.D.   On: 02/03/2019 17:40   DG FEMUR, MIN 2 VIEWS RIGHT  Result Date: 02/03/2019 CLINICAL DATA:  Distal femur fracture EXAM: RIGHT FEMUR 2 VIEWS COMPARISON:  01/30/2019 FINDINGS: Multiple intraoperative spot images demonstrate plate and screw fixation across the distal right femoral fracture. No visible hardware complicating feature. IMPRESSION: Plate and screw fixation across the distal right femoral fracture. Electronically Signed   By: Rolm Baptise M.D.   On: 02/03/2019 17:37   DG FEMUR PORT, MIN 2 VIEWS RIGHT  Result Date: 02/04/2019 CLINICAL DATA:  Status post ORIF of right distal femur fracture. EXAM: RIGHT FEMUR PORTABLE 2 VIEW COMPARISON:  01/29/2019 FINDINGS: There is a long lateral femoral sideplate and multiple compression screws transfixing the complex comminuted distal femur fracture. Multiple distal screws are noted. The antibiotic beads have been removed and there is a large area of bone cement in the fracture defect. The right hip appears  normal. Prior external fixator screw holes are noted in the femur and proximal tibia. Nondisplaced patellar fractures are noted. IMPRESSION: Open reduction and internal fixation of complex comminuted distal femur fractures with good position and alignment and large focus of bone cement in the fracture defect. Stable nondisplaced patellar  fractures. Electronically Signed   By: Rudie Meyer M.D.   On: 02/04/2019 09:18    Assessment/Plan: S/p left hemicraniectomy for SDH, doing well POD#6. - Keppra x 7 days post-TBI (can d/c on 1/14) - BMP this am revealed Na+ of 124.  Recommend repeat Na+ serum, with correction as needed via 3% saline to keep Na+ level 135-145 - Recommend keeping Hgb > 7 - Helmet to be worn when OOB - cont PT, likely rehab with speech/cognitive and TBI therapies   LOS: 6 days     Bedelia Person 02/04/2019, 10:58 AM
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
## 2019-02-04 NOTE — Progress Notes (Signed)
VAST RN to bedside for CODE BLUE. Pt has working central line and does not need further access at this time.
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
## 2019-02-04 NOTE — Progress Notes (Signed)
Responded to Code blue. Nurse stated pt is stable and no family is present at this time. I was spiritually present and offered a silent prayer. Chaplain available upon request.   Chaplain Resident Orest Dikes  [phone]

## 2019-02-04 NOTE — Procedures (Addendum)
Intubation Note Indication: status epilepticus, ongoing Meds: Etomidate, rocuronium Intubated with 8-0 tube without difficulty, hooked to vent Sats good post  Myrla Halsted MD PCCM

## 2019-02-04 NOTE — Progress Notes (Signed)
Spoke with bedside RN, aware PICC will not be placed tonight. Patient had central line placed 1/11. RN stated PICC may not be needed. Will continue to follow.

## 2019-02-05 LAB — TYPE AND SCREEN
ABO/RH(D): O POS
Antibody Screen: NEGATIVE
Unit division: 0
Unit division: 0

## 2019-02-05 LAB — BPAM RBC
Blood Product Expiration Date: 202102092359
Blood Product Expiration Date: 202102102359
ISSUE DATE / TIME: 202101111356
ISSUE DATE / TIME: 202101111356
Unit Type and Rh: 5100
Unit Type and Rh: 5100

## 2019-02-05 LAB — BASIC METABOLIC PANEL
Anion gap: 7 (ref 5–15)
BUN: 8 mg/dL (ref 6–20)
CO2: 18 mmol/L — ABNORMAL LOW (ref 22–32)
Calcium: 7.4 mg/dL — ABNORMAL LOW (ref 8.9–10.3)
Chloride: 109 mmol/L (ref 98–111)
Creatinine, Ser: 0.4 mg/dL — ABNORMAL LOW (ref 0.44–1.00)
GFR calc Af Amer: >60 mL/min (ref >60)
GFR calc non Af Amer: >60 mL/min (ref >60)
Glucose, Bld: 103 mg/dL — ABNORMAL HIGH (ref 70–99)
Potassium: 3.4 mmol/L — ABNORMAL LOW (ref 3.5–5.1)
Sodium: 134 mmol/L — ABNORMAL LOW (ref 135–145)

## 2019-02-05 LAB — SODIUM
Sodium: 129 mmol/L — ABNORMAL LOW (ref 135–145)
Sodium: 134 mmol/L — ABNORMAL LOW (ref 135–145)
Sodium: 135 mmol/L (ref 135–145)

## 2019-02-05 LAB — SODIUM, URINE, RANDOM: Sodium, Ur: 148 mmol/L

## 2019-02-05 LAB — GLUCOSE, CAPILLARY
Glucose-Capillary: 104 mg/dL — ABNORMAL HIGH (ref 70–99)
Glucose-Capillary: 115 mg/dL — ABNORMAL HIGH (ref 70–99)
Glucose-Capillary: 99 mg/dL (ref 70–99)
Glucose-Capillary: 117 mg/dL — ABNORMAL HIGH (ref 70–99)

## 2019-02-05 LAB — PHOSPHORUS: Phosphorus: 2.3 mg/dL — ABNORMAL LOW (ref 2.5–4.6)

## 2019-02-05 LAB — CREATININE, URINE, RANDOM: Creatinine, Urine: 88.72 mg/dL

## 2019-02-05 LAB — MAGNESIUM: Magnesium: 1.9 mg/dL (ref 1.7–2.4)

## 2019-02-05 MED ORDER — ORAL CARE MOUTH RINSE: 15.0000 mL | Freq: Two times a day (BID) | OROMUCOSAL | Status: DC

## 2019-02-05 MED ORDER — SODIUM CHLORIDE 3 % IV SOLN
INTRAVENOUS | Status: AC
  Filled 2019-02-05: qty 500

## 2019-02-05 MED ORDER — LEVETIRACETAM 100 MG/ML PO SOLN: 500.0000 mg | Freq: Two times a day (BID) | ORAL | Status: DC

## 2019-02-05 MED ORDER — LEVETIRACETAM 100 MG/ML PO SOLN
750.0000 mg | Freq: Two times a day (BID) | ORAL | Status: DC
  Administered 2019-02-05 – 2019-02-10 (×10): 750 mg via ORAL
  Filled 2019-02-05 (×10): qty 10

## 2019-02-05 MED ORDER — VITAMIN D 25 MCG (1000 UNIT) PO TABS
2000.0000 [IU] | ORAL_TABLET | Freq: Two times a day (BID) | ORAL | Status: DC
  Administered 2019-02-05 – 2019-02-10 (×10): 2000 [IU] via ORAL
  Filled 2019-02-05 (×11): qty 2

## 2019-02-05 MED ORDER — SODIUM CHLORIDE 1 G PO TABS
1.0000 g | ORAL_TABLET | Freq: Three times a day (TID) | ORAL | Status: DC
  Administered 2019-02-05 (×3): 1 g
  Filled 2019-02-05 (×2): qty 1

## 2019-02-05 MED ORDER — OXYCODONE HCL 5 MG PO TABS
5.0000 mg | ORAL_TABLET | ORAL | Status: DC | PRN
  Administered 2019-02-06 – 2019-02-10 (×22): 10 mg via ORAL
  Filled 2019-02-05 (×22): qty 2

## 2019-02-05 MED ORDER — LEVETIRACETAM 100 MG/ML PO SOLN: 750.0000 mg | Freq: Two times a day (BID) | ORAL | Status: DC

## 2019-02-05 MED ORDER — OXYCODONE HCL 5 MG PO TABS
5.0000 mg | ORAL_TABLET | ORAL | Status: DC | PRN
  Administered 2019-02-05 (×3): 10 mg
  Filled 2019-02-05 (×3): qty 2

## 2019-02-05 MED ORDER — SODIUM CHLORIDE 1 G PO TABS
1.0000 g | ORAL_TABLET | Freq: Three times a day (TID) | ORAL | Status: DC
  Administered 2019-02-06: 1 g via ORAL
  Filled 2019-02-05 (×2): qty 1
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
## 2019-02-05 NOTE — Progress Notes (Signed)
Assisted tele visit to patient with mother.  Raychelle Hudman P, RN   
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
## 2019-02-05 NOTE — Progress Notes (Signed)
Rehab Admissions Coordinator Note:  Patient was screened by Clois Dupes for appropriateness for an Inpatient Acute Rehab Consult per OT recs..  At this time, we are recommending Inpatient Rehab consult. Please place order.  Clois Dupes RN MSN 02/05/2019, 4:32 PM  I can be reached at [phone].
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
## 2019-02-05 NOTE — Progress Notes (Signed)
Dr. Elwyn Lade. Ok with canceling PICC placement for now.

## 2019-02-05 NOTE — Progress Notes (Signed)
Assisted tele visit to patient with family member.  Susan Cygan Anderson, RN   

## 2019-02-05 NOTE — Procedures (Signed)
Extubation Procedure Note  Patient Details:   Name: Susan Wilkinson DOB: Nov 26, 1995 MRN: [id number]   Airway Documentation:    Vent end date: 02/05/19 Vent end time: 1221   Evaluation  O2 sats: stable throughout Complications: No apparent complications Patient did tolerate procedure well. Bilateral Breath Sounds: Clear   Yes   Patient extubated per MD order without complications. Positive cuff leak. No stridor noted. RN and MD at bedside. Vitals are stable. Patient now on 4L Rew.   [name] 02/05/2019, 12:22 PM

## 2019-02-06 DIAGNOSIS — S42036A Nondisplaced fracture of lateral end of unspecified clavicle, initial encounter for closed fracture: Secondary | ICD-10-CM

## 2019-02-06 DIAGNOSIS — S7291XC Unspecified fracture of right femur, initial encounter for open fracture type IIIA, IIIB, or IIIC: Secondary | ICD-10-CM

## 2019-02-06 DIAGNOSIS — S065X9A Traumatic subdural hemorrhage with loss of consciousness of unspecified duration, initial encounter: Principal | ICD-10-CM

## 2019-02-06 LAB — CBC
HCT: 21 % — ABNORMAL LOW (ref 36.0–46.0)
Hemoglobin: 6.9 g/dL — CL (ref 12.0–15.0)
MCH: 28.3 pg (ref 26.0–34.0)
MCHC: 32.9 g/dL (ref 30.0–36.0)
MCV: 86.1 fL (ref 80.0–100.0)
Platelets: 392 10*3/uL (ref 150–400)
RBC: 2.44 MIL/uL — ABNORMAL LOW (ref 3.87–5.11)
RDW: 17.9 % — ABNORMAL HIGH (ref 11.5–15.5)
WBC: 10.1 10*3/uL (ref 4.0–10.5)
nRBC: 0 % (ref 0.0–0.2)

## 2019-02-06 LAB — GLUCOSE, CAPILLARY
Glucose-Capillary: 141 mg/dL — ABNORMAL HIGH (ref 70–99)
Glucose-Capillary: 97 mg/dL (ref 70–99)
Glucose-Capillary: 237 mg/dL — ABNORMAL HIGH (ref 70–99)
Glucose-Capillary: 241 mg/dL — ABNORMAL HIGH (ref 70–99)

## 2019-02-06 LAB — BASIC METABOLIC PANEL
Anion gap: 9 (ref 5–15)
BUN: 5 mg/dL — ABNORMAL LOW (ref 6–20)
CO2: 22 mmol/L (ref 22–32)
Calcium: 7.8 mg/dL — ABNORMAL LOW (ref 8.9–10.3)
Chloride: 100 mmol/L (ref 98–111)
Creatinine, Ser: 0.32 mg/dL — ABNORMAL LOW (ref 0.44–1.00)
GFR calc Af Amer: >60 mL/min (ref >60)
GFR calc non Af Amer: >60 mL/min (ref >60)
Glucose, Bld: 108 mg/dL — ABNORMAL HIGH (ref 70–99)
Potassium: 3.2 mmol/L — ABNORMAL LOW (ref 3.5–5.1)
Sodium: 131 mmol/L — ABNORMAL LOW (ref 135–145)

## 2019-02-06 LAB — HEMOGLOBIN AND HEMATOCRIT, BLOOD
HCT: 25.9 % — ABNORMAL LOW (ref 36.0–46.0)
Hemoglobin: 8.2 g/dL — ABNORMAL LOW (ref 12.0–15.0)

## 2019-02-06 LAB — PREPARE RBC (CROSSMATCH)

## 2019-02-06 LAB — SODIUM: Sodium: 135 mmol/L (ref 135–145)

## 2019-02-06 MED ORDER — SODIUM CHLORIDE 1 G PO TABS
2.0000 g | ORAL_TABLET | Freq: Three times a day (TID) | ORAL | Status: DC
  Administered 2019-02-06 – 2019-02-07 (×3): 2 g via ORAL
  Filled 2019-02-06 (×3): qty 2

## 2019-02-06 MED ORDER — SODIUM CHLORIDE 0.9% IV SOLUTION: Freq: Once | INTRAVENOUS | Status: AC

## 2019-02-06 MED ORDER — POTASSIUM CHLORIDE CRYS ER 20 MEQ PO TBCR
40.0000 meq | EXTENDED_RELEASE_TABLET | Freq: Once | ORAL | Status: AC
  Administered 2019-02-06: 40 meq via ORAL
  Filled 2019-02-06: qty 2
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
## 2019-02-06 NOTE — Progress Notes (Signed)
Inpatient Rehab Admissions:  Inpatient Rehab Consult received.  I met with patient at the bedside, and her mother joined via tele-visit, for rehabilitation assessment and to discuss goals and expectations of an inpatient rehab admission.  Both are in agreement to pursue CIR when pt medically ready and bed available.  Will follow for timing of possible admission over the next few days pending bed availability.   Signed: Shann Medal, PT, DPT Admissions Coordinator [phone] 02/06/19  3:25 PM
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
## 2019-02-06 NOTE — Progress Notes (Signed)
Patient's father, Donette Mainwaring, would like for the patient's doctors to call tomorrow 02/07/19 with any updates with the patient. Father stated that he has not heard from neuro surgery, neurology, or trauma MD's during the day, nor was he updated when the patient had a change on 02/04/19. Father is the first contact in patient's chart.
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
## 2019-02-06 NOTE — Progress Notes (Signed)
Assisted tele visit to patient with family member.  Kaylany Tesoriero R, RN  
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
## 2019-02-06 NOTE — Progress Notes (Signed)
CRITICAL VALUE ALERT  Critical Value:  hgb 6.9  Date & Time Notified: 1/14 1100  Provider Notified: Dr. Janee Morn   Orders Received/Actions taken: awaiting orders

## 2019-02-06 NOTE — Progress Notes (Signed)
Assisted tele visit to patient with mother.  Susan Wilkinson P, RN   
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
## 2019-02-06 NOTE — Anesthesia Postprocedure Evaluation (Signed)
Anesthesia Post Note  Patient: Susan Wilkinson  Procedure(s) Performed: OPEN REDUCTION INTERNAL FIXATION (ORIF) DISTAL FEMUR FRACTURE (Right ) OPEN REDUCTION INTERNAL FIXATION (ORIF) PROXIMAL HUMERUS FRACTURE (Left )     Patient location during evaluation: PACU Anesthesia Type: General Level of consciousness: awake and alert Pain management: pain level controlled Vital Signs Assessment: post-procedure vital signs reviewed and stable Respiratory status: spontaneous breathing, nonlabored ventilation and respiratory function stable Cardiovascular status: blood pressure returned to baseline and stable Postop Assessment: no apparent nausea or vomiting Anesthetic complications: no    Last Vitals:  Vitals:   02/06/19 0600 02/06/19 0700  BP: 128/79   Pulse: 94   Resp: (!) 21   Temp:  37.7 C  SpO2: 100%     Last Pain:  Vitals:   02/06/19 0700  TempSrc: Oral  PainSc:                  Beryle Lathe
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
## 2019-02-06 NOTE — Progress Notes (Signed)
Assisted tele visit to patient with family member.  Izaiha Lo R, RN  

## 2019-02-07 LAB — BPAM RBC
Blood Product Expiration Date: 202102082359
ISSUE DATE / TIME: 202101141340
Unit Type and Rh: 5100

## 2019-02-07 LAB — BASIC METABOLIC PANEL
Anion gap: 7 (ref 5–15)
BUN: <5 mg/dL — ABNORMAL LOW (ref 6–20)
CO2: 24 mmol/L (ref 22–32)
Calcium: 8.1 mg/dL — ABNORMAL LOW (ref 8.9–10.3)
Chloride: 102 mmol/L (ref 98–111)
Creatinine, Ser: 0.47 mg/dL (ref 0.44–1.00)
GFR calc Af Amer: >60 mL/min (ref >60)
GFR calc non Af Amer: >60 mL/min (ref >60)
Glucose, Bld: 113 mg/dL — ABNORMAL HIGH (ref 70–99)
Potassium: 3.2 mmol/L — ABNORMAL LOW (ref 3.5–5.1)
Sodium: 133 mmol/L — ABNORMAL LOW (ref 135–145)

## 2019-02-07 LAB — GLUCOSE, CAPILLARY
Glucose-Capillary: 136 mg/dL — ABNORMAL HIGH (ref 70–99)
Glucose-Capillary: 153 mg/dL — ABNORMAL HIGH (ref 70–99)
Glucose-Capillary: 112 mg/dL — ABNORMAL HIGH (ref 70–99)
Glucose-Capillary: 179 mg/dL — ABNORMAL HIGH (ref 70–99)
Glucose-Capillary: 151 mg/dL — ABNORMAL HIGH (ref 70–99)

## 2019-02-07 LAB — SODIUM
Sodium: 138 mmol/L (ref 135–145)
Sodium: 137 mmol/L (ref 135–145)
Sodium: 135 mmol/L (ref 135–145)

## 2019-02-07 LAB — CBC
HCT: 24.9 % — ABNORMAL LOW (ref 36.0–46.0)
Hemoglobin: 8 g/dL — ABNORMAL LOW (ref 12.0–15.0)
MCH: 26.8 pg (ref 26.0–34.0)
MCHC: 32.1 g/dL (ref 30.0–36.0)
MCV: 83.3 fL (ref 80.0–100.0)
Platelets: 507 10*3/uL — ABNORMAL HIGH (ref 150–400)
RBC: 2.99 MIL/uL — ABNORMAL LOW (ref 3.87–5.11)
RDW: 19.7 % — ABNORMAL HIGH (ref 11.5–15.5)
WBC: 10 10*3/uL (ref 4.0–10.5)
nRBC: 0 % (ref 0.0–0.2)

## 2019-02-07 LAB — TYPE AND SCREEN
ABO/RH(D): O POS
Antibody Screen: NEGATIVE
Unit division: 0

## 2019-02-07 LAB — PHOSPHORUS: Phosphorus: 2.8 mg/dL (ref 2.5–4.6)

## 2019-02-07 LAB — MAGNESIUM: Magnesium: 1.6 mg/dL — ABNORMAL LOW (ref 1.7–2.4)

## 2019-02-07 MED ORDER — MORPHINE SULFATE (PF) 2 MG/ML IV SOLN
2.0000 mg | Freq: Four times a day (QID) | INTRAVENOUS | Status: DC | PRN
  Administered 2019-02-07 – 2019-02-10 (×12): 2 mg via INTRAVENOUS
  Filled 2019-02-07 (×14): qty 1

## 2019-02-07 MED ORDER — POTASSIUM CHLORIDE 20 MEQ/15ML (10%) PO SOLN
40.0000 meq | Freq: Once | ORAL | Status: AC
  Administered 2019-02-07: 10:00:00 40 meq via ORAL
  Filled 2019-02-07: qty 30

## 2019-02-07 MED ORDER — SODIUM CHLORIDE 1 G PO TABS
3.0000 g | ORAL_TABLET | Freq: Three times a day (TID) | ORAL | Status: DC
  Administered 2019-02-07 – 2019-02-10 (×10): 3 g via ORAL
  Filled 2019-02-07 (×11): qty 3
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
## 2019-02-07 NOTE — Progress Notes (Signed)
Inpatient Rehab Admissions Coordinator:   I have no bed available for this pt today. Will f/u on Monday.   Estill Dooms, PT, DPT Admissions Coordinator [phone] 02/07/19  4:51 PM

## 2019-02-08 LAB — CBC
HCT: 28.8 % — ABNORMAL LOW (ref 36.0–46.0)
Hemoglobin: 9.2 g/dL — ABNORMAL LOW (ref 12.0–15.0)
MCH: 26.6 pg (ref 26.0–34.0)
MCHC: 31.9 g/dL (ref 30.0–36.0)
MCV: 83.2 fL (ref 80.0–100.0)
Platelets: 669 10*3/uL — ABNORMAL HIGH (ref 150–400)
RBC: 3.46 MIL/uL — ABNORMAL LOW (ref 3.87–5.11)
RDW: 19.9 % — ABNORMAL HIGH (ref 11.5–15.5)
WBC: 11.6 10*3/uL — ABNORMAL HIGH (ref 4.0–10.5)
nRBC: 0 % (ref 0.0–0.2)

## 2019-02-08 LAB — SODIUM: Sodium: 134 mmol/L — ABNORMAL LOW (ref 135–145)

## 2019-02-08 LAB — GLUCOSE, CAPILLARY
Glucose-Capillary: 132 mg/dL — ABNORMAL HIGH (ref 70–99)
Glucose-Capillary: 108 mg/dL — ABNORMAL HIGH (ref 70–99)
Glucose-Capillary: 139 mg/dL — ABNORMAL HIGH (ref 70–99)
Glucose-Capillary: 119 mg/dL — ABNORMAL HIGH (ref 70–99)
Glucose-Capillary: 119 mg/dL — ABNORMAL HIGH (ref 70–99)

## 2019-02-08 LAB — BASIC METABOLIC PANEL
Anion gap: 12 (ref 5–15)
BUN: 5 mg/dL — ABNORMAL LOW (ref 6–20)
CO2: 21 mmol/L — ABNORMAL LOW (ref 22–32)
Calcium: 8.8 mg/dL — ABNORMAL LOW (ref 8.9–10.3)
Chloride: 101 mmol/L (ref 98–111)
Creatinine, Ser: 0.4 mg/dL — ABNORMAL LOW (ref 0.44–1.00)
GFR calc Af Amer: >60 mL/min (ref >60)
GFR calc non Af Amer: >60 mL/min (ref >60)
Glucose, Bld: 134 mg/dL — ABNORMAL HIGH (ref 70–99)
Potassium: 4 mmol/L (ref 3.5–5.1)
Sodium: 134 mmol/L — ABNORMAL LOW (ref 135–145)

## 2019-02-08 LAB — PHOSPHORUS: Phosphorus: 3.9 mg/dL (ref 2.5–4.6)

## 2019-02-08 LAB — MAGNESIUM: Magnesium: 1.5 mg/dL — ABNORMAL LOW (ref 1.7–2.4)

## 2019-02-08 NOTE — Discharge Instructions (Signed)
[facility] Address: [place] [address] [place], [place] [address] Hours:  Closed ? [place] Phone: [phone]

## 2019-02-08 NOTE — Progress Notes (Signed)
2020: Report called to Vi on 5N. Called father to notify of room change. All patient belongings sent with patient.

## 2019-02-08 NOTE — Progress Notes (Signed)
Subjective: The patient is alert.  She has a flat affect.  She is in no apparent distress.  Objective: Vital signs in last 24 hours: Temp:  [98.3 F (36.8 C)-98.7 F (37.1 C)] 98.3 F (36.8 C) (01/16 0339) Pulse Rate:  [87-108] 99 (01/16 0339) Resp:  [18-27] 18 (01/16 0339) BP: (118-139)/(72-91) 139/88 (01/16 0841) SpO2:  [98 %-100 %] 100 % (01/16 0339) Weight:  [58.6 kg] 58.6 kg (01/16 0509) Estimated body mass index is 22.88 kg/m as calculated from the following:   Height as of this encounter: 5\' 3"  (1.6 m).   Weight as of this encounter: 58.6 kg.   Intake/Output from previous day: No intake/output data recorded. Intake/Output this shift: No intake/output data recorded.  Physical exam the patient is alert and oriented to place.  Her pupils are equal.  She is moving all 4 extremities.  Lab Results: Recent Labs    02/07/19 0650 02/08/19 0411  WBC 10.0 11.6*  HGB 8.0* 9.2*  HCT 24.9* 28.8*  PLT 507* 669*   BMET Recent Labs    02/07/19 0650 02/07/19 1158 02/07/19 2110 02/08/19 0411  NA 133*   < > 135 134*  K 3.2*  --   --  4.0  CL 102  --   --  101  CO2 24  --   --  21*  GLUCOSE 113*  --   --  134*  BUN <5*  --   --  5*  CREATININE 0.47  --   --  0.40*  CALCIUM 8.1*  --   --  8.8*   < > = values in this interval not displayed.    Studies/Results: No results found.  Assessment/Plan: Postop day #10: The patient is stable.  It looks like she will need rehab.  LOS: 10 days     02/10/19 02/08/2019, 9:13 AM
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
## 2019-02-08 NOTE — Progress Notes (Signed)
CSW met with patient to engage in SBIRT per trauma protocol. Patient scored a total of 22 on their SBIRT exam. CSW provided psychoeducation on substance use and offered patient with resources as appropriate. CSW provided brief intervention to patient discussing outpatient therapy and medication resources. Patient accepted outpatient resources. Patient responded openly and remorseful to discussions of substance use.   Daphine Deutscher, LCSW, [place] [facility], [place], [place] [phone]

## 2019-02-08 NOTE — Plan of Care (Signed)
  Problem: Skin Integrity: Goal: Risk for impaired skin integrity will decrease Outcome: Progressing   Problem: Safety: Goal: Ability to remain free from injury will improve Outcome: Progressing   Problem: Pain Managment: Goal: General experience of comfort will improve Outcome: Progressing   Problem: Nutrition: Goal: Adequate nutrition will be maintained Outcome: Progressing
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
## 2019-02-09 ENCOUNTER — Other Ambulatory Visit: Payer: Self-pay

## 2019-02-09 ENCOUNTER — Encounter (HOSPITAL_COMMUNITY): Payer: Self-pay

## 2019-02-09 LAB — CBC
HCT: 29.4 % — ABNORMAL LOW (ref 36.0–46.0)
Hemoglobin: 9.6 g/dL — ABNORMAL LOW (ref 12.0–15.0)
MCH: 26.9 pg (ref 26.0–34.0)
MCHC: 32.7 g/dL (ref 30.0–36.0)
MCV: 82.4 fL (ref 80.0–100.0)
Platelets: 844 10*3/uL — ABNORMAL HIGH (ref 150–400)
RBC: 3.57 MIL/uL — ABNORMAL LOW (ref 3.87–5.11)
RDW: 19.7 % — ABNORMAL HIGH (ref 11.5–15.5)
WBC: 14 10*3/uL — ABNORMAL HIGH (ref 4.0–10.5)
nRBC: 0 % (ref 0.0–0.2)

## 2019-02-09 LAB — BASIC METABOLIC PANEL
Anion gap: 13 (ref 5–15)
BUN: 8 mg/dL (ref 6–20)
CO2: 21 mmol/L — ABNORMAL LOW (ref 22–32)
Calcium: 9 mg/dL (ref 8.9–10.3)
Chloride: 99 mmol/L (ref 98–111)
Creatinine, Ser: 0.43 mg/dL — ABNORMAL LOW (ref 0.44–1.00)
GFR calc Af Amer: >60 mL/min (ref >60)
GFR calc non Af Amer: >60 mL/min (ref >60)
Glucose, Bld: 116 mg/dL — ABNORMAL HIGH (ref 70–99)
Potassium: 4.1 mmol/L (ref 3.5–5.1)
Sodium: 133 mmol/L — ABNORMAL LOW (ref 135–145)

## 2019-02-09 LAB — PHOSPHORUS: Phosphorus: 4.2 mg/dL (ref 2.5–4.6)

## 2019-02-09 LAB — SODIUM: Sodium: 136 mmol/L (ref 135–145)

## 2019-02-09 LAB — GLUCOSE, CAPILLARY
Glucose-Capillary: 101 mg/dL — ABNORMAL HIGH (ref 70–99)
Glucose-Capillary: 173 mg/dL — ABNORMAL HIGH (ref 70–99)
Glucose-Capillary: 204 mg/dL — ABNORMAL HIGH (ref 70–99)

## 2019-02-09 LAB — MAGNESIUM: Magnesium: 1.7 mg/dL (ref 1.7–2.4)
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
## 2019-02-09 NOTE — Plan of Care (Signed)
  Problem: Clinical Measurements: Goal: Ability to maintain clinical measurements within normal limits will improve Outcome: Progressing Goal: Will remain free from infection Outcome: Progressing   Problem: Activity: Goal: Risk for activity intolerance will decrease Outcome: Progressing   Problem: Pain Managment: Goal: General experience of comfort will improve Outcome: Progressing   Problem: Safety: Goal: Ability to remain free from injury will improve Outcome: Progressing   Problem: Coping: Goal: Level of anxiety will decrease Outcome: Progressing

## 2019-02-10 ENCOUNTER — Inpatient Hospital Stay (HOSPITAL_COMMUNITY)
Admission: RE | Admit: 2019-02-10 | Discharge: 2019-02-14 | DRG: 945 | Disposition: A | Discharge status: Home Health | Payer: No Typology Code available for payment source | Source: Intra-hospital | Attending: Physical Medicine & Rehabilitation | Admitting: Physical Medicine & Rehabilitation

## 2019-02-10 ENCOUNTER — Other Ambulatory Visit: Payer: Self-pay

## 2019-02-10 ENCOUNTER — Encounter (HOSPITAL_COMMUNITY): Payer: Self-pay | Admitting: Physical Medicine & Rehabilitation

## 2019-02-10 DIAGNOSIS — S82001D Unspecified fracture of right patella, subsequent encounter for closed fracture with routine healing: Secondary | ICD-10-CM

## 2019-02-10 DIAGNOSIS — S36039D Unspecified laceration of spleen, subsequent encounter: Secondary | ICD-10-CM | POA: Diagnosis not present

## 2019-02-10 DIAGNOSIS — D62 Acute posthemorrhagic anemia: Secondary | ICD-10-CM | POA: Diagnosis present

## 2019-02-10 DIAGNOSIS — S42031D Displaced fracture of lateral end of right clavicle, subsequent encounter for fracture with routine healing: Secondary | ICD-10-CM | POA: Diagnosis not present

## 2019-02-10 DIAGNOSIS — S069XAA Unspecified intracranial injury with loss of consciousness status unknown, initial encounter: Secondary | ICD-10-CM | POA: Diagnosis present

## 2019-02-10 DIAGNOSIS — S82831D Other fracture of upper and lower end of right fibula, subsequent encounter for closed fracture with routine healing: Secondary | ICD-10-CM

## 2019-02-10 DIAGNOSIS — Y92410 Unspecified street and highway as the place of occurrence of the external cause: Secondary | ICD-10-CM | POA: Diagnosis not present

## 2019-02-10 DIAGNOSIS — S76111A Strain of right quadriceps muscle, fascia and tendon, initial encounter: Secondary | ICD-10-CM

## 2019-02-10 DIAGNOSIS — F1910 Other psychoactive substance abuse, uncomplicated: Secondary | ICD-10-CM | POA: Diagnosis present

## 2019-02-10 DIAGNOSIS — S72451D Displaced supracondylar fracture without intracondylar extension of lower end of right femur, subsequent encounter for closed fracture with routine healing: Secondary | ICD-10-CM | POA: Diagnosis not present

## 2019-02-10 DIAGNOSIS — S065X9A Traumatic subdural hemorrhage with loss of consciousness of unspecified duration, initial encounter: Secondary | ICD-10-CM

## 2019-02-10 DIAGNOSIS — S065X9D Traumatic subdural hemorrhage with loss of consciousness of unspecified duration, subsequent encounter: Secondary | ICD-10-CM | POA: Diagnosis present

## 2019-02-10 DIAGNOSIS — S2232XD Fracture of one rib, left side, subsequent encounter for fracture with routine healing: Secondary | ICD-10-CM | POA: Diagnosis not present

## 2019-02-10 DIAGNOSIS — E871 Hypo-osmolality and hyponatremia: Secondary | ICD-10-CM | POA: Diagnosis present

## 2019-02-10 DIAGNOSIS — E559 Vitamin D deficiency, unspecified: Secondary | ICD-10-CM | POA: Diagnosis present

## 2019-02-10 DIAGNOSIS — S42492D Other displaced fracture of lower end of left humerus, subsequent encounter for fracture with routine healing: Secondary | ICD-10-CM | POA: Diagnosis not present

## 2019-02-10 DIAGNOSIS — S069X0D Unspecified intracranial injury without loss of consciousness, subsequent encounter: Secondary | ICD-10-CM

## 2019-02-10 DIAGNOSIS — F17210 Nicotine dependence, cigarettes, uncomplicated: Secondary | ICD-10-CM | POA: Diagnosis present

## 2019-02-10 DIAGNOSIS — S069X9A Unspecified intracranial injury with loss of consciousness of unspecified duration, initial encounter: Secondary | ICD-10-CM | POA: Diagnosis present

## 2019-02-10 DIAGNOSIS — D72829 Elevated white blood cell count, unspecified: Secondary | ICD-10-CM | POA: Diagnosis present

## 2019-02-10 DIAGNOSIS — S42292D Other displaced fracture of upper end of left humerus, subsequent encounter for fracture with routine healing: Secondary | ICD-10-CM | POA: Diagnosis not present

## 2019-02-10 DIAGNOSIS — S065XAA Traumatic subdural hemorrhage with loss of consciousness status unknown, initial encounter: Secondary | ICD-10-CM | POA: Diagnosis present

## 2019-02-10 DIAGNOSIS — T148XXA Other injury of unspecified body region, initial encounter: Secondary | ICD-10-CM

## 2019-02-10 LAB — BASIC METABOLIC PANEL
Anion gap: 12 (ref 5–15)
BUN: 13 mg/dL (ref 6–20)
CO2: 23 mmol/L (ref 22–32)
Calcium: 9.4 mg/dL (ref 8.9–10.3)
Chloride: 100 mmol/L (ref 98–111)
Creatinine, Ser: 0.44 mg/dL (ref 0.44–1.00)
GFR calc Af Amer: >60 mL/min (ref >60)
GFR calc non Af Amer: >60 mL/min (ref >60)
Glucose, Bld: 112 mg/dL — ABNORMAL HIGH (ref 70–99)
Potassium: 4.4 mmol/L (ref 3.5–5.1)
Sodium: 135 mmol/L (ref 135–145)

## 2019-02-10 LAB — CBC
HCT: 34.1 % — ABNORMAL LOW (ref 36.0–46.0)
Hemoglobin: 10.8 g/dL — ABNORMAL LOW (ref 12.0–15.0)
MCH: 26.9 pg (ref 26.0–34.0)
MCHC: 31.7 g/dL (ref 30.0–36.0)
MCV: 84.8 fL (ref 80.0–100.0)
Platelets: 963 10*3/uL (ref 150–400)
RBC: 4.02 MIL/uL (ref 3.87–5.11)
RDW: 20 % — ABNORMAL HIGH (ref 11.5–15.5)
WBC: 12.8 10*3/uL — ABNORMAL HIGH (ref 4.0–10.5)
nRBC: 0 % (ref 0.0–0.2)

## 2019-02-10 LAB — GLUCOSE, CAPILLARY: Glucose-Capillary: 111 mg/dL — ABNORMAL HIGH (ref 70–99)

## 2019-02-10 LAB — MAGNESIUM: Magnesium: 2 mg/dL (ref 1.7–2.4)

## 2019-02-10 LAB — PHOSPHORUS: Phosphorus: 4 mg/dL (ref 2.5–4.6)

## 2019-02-10 MED ORDER — ALUM & MAG HYDROXIDE-SIMETH 200-200-20 MG/5ML PO SUSP: 30.0000 mL | ORAL | Status: DC | PRN

## 2019-02-10 MED ORDER — VITAMIN D 25 MCG (1000 UNIT) PO TABS
2000.0000 [IU] | ORAL_TABLET | Freq: Two times a day (BID) | ORAL | Status: DC
  Administered 2019-02-10 – 2019-02-14 (×8): 2000 [IU] via ORAL
  Filled 2019-02-10 (×8): qty 2

## 2019-02-10 MED ORDER — PROCHLORPERAZINE MALEATE 5 MG PO TABS: 5.0000 mg | ORAL_TABLET | Freq: Four times a day (QID) | ORAL | Status: DC | PRN

## 2019-02-10 MED ORDER — OXYCODONE HCL ER 10 MG PO T12A
10.0000 mg | EXTENDED_RELEASE_TABLET | Freq: Two times a day (BID) | ORAL | Status: DC
  Administered 2019-02-10 – 2019-02-13 (×6): 10 mg via ORAL
  Filled 2019-02-10 (×6): qty 1

## 2019-02-10 MED ORDER — GUAIFENESIN-DM 100-10 MG/5ML PO SYRP: 5.0000 mL | ORAL_SOLUTION | Freq: Four times a day (QID) | ORAL | Status: DC | PRN

## 2019-02-10 MED ORDER — TRAZODONE HCL 50 MG PO TABS
25.0000 mg | ORAL_TABLET | Freq: Every evening | ORAL | Status: DC | PRN
  Administered 2019-02-10: 25 mg via ORAL
  Administered 2019-02-11 – 2019-02-12 (×2): 50 mg via ORAL
  Filled 2019-02-10 (×3): qty 1

## 2019-02-10 MED ORDER — DIPHENHYDRAMINE HCL 12.5 MG/5ML PO ELIX: 12.5000 mg | ORAL_SOLUTION | Freq: Four times a day (QID) | ORAL | Status: DC | PRN

## 2019-02-10 MED ORDER — AQUAPHOR EX OINT
TOPICAL_OINTMENT | Freq: Two times a day (BID) | CUTANEOUS | Status: AC
  Filled 2019-02-10: qty 50

## 2019-02-10 MED ORDER — BISACODYL 10 MG RE SUPP: 10.0000 mg | Freq: Every day | RECTAL | Status: DC | PRN

## 2019-02-10 MED ORDER — ASCORBIC ACID 500 MG PO TABS
250.0000 mg | ORAL_TABLET | Freq: Two times a day (BID) | ORAL | Status: DC
  Administered 2019-02-10 – 2019-02-14 (×8): 250 mg via ORAL
  Filled 2019-02-10 (×8): qty 1

## 2019-02-10 MED ORDER — HYDROCERIN EX CREA
TOPICAL_CREAM | Freq: Two times a day (BID) | CUTANEOUS | Status: DC
  Filled 2019-02-10: qty 113

## 2019-02-10 MED ORDER — OXYCODONE HCL 5 MG PO TABS
5.0000 mg | ORAL_TABLET | ORAL | Status: DC | PRN
  Administered 2019-02-10: 10 mg via ORAL
  Administered 2019-02-11: 5 mg via ORAL
  Administered 2019-02-11: 10 mg via ORAL
  Administered 2019-02-11: 5 mg via ORAL
  Administered 2019-02-11 – 2019-02-12 (×2): 10 mg via ORAL
  Administered 2019-02-12: 5 mg via ORAL
  Administered 2019-02-12 – 2019-02-13 (×3): 10 mg via ORAL
  Administered 2019-02-13 (×2): 5 mg via ORAL
  Administered 2019-02-13: 10 mg via ORAL
  Administered 2019-02-14 (×2): 5 mg via ORAL
  Administered 2019-02-14: 10 mg via ORAL
  Filled 2019-02-10 (×3): qty 2
  Filled 2019-02-10 (×3): qty 1
  Filled 2019-02-10 (×4): qty 2
  Filled 2019-02-10 (×2): qty 1
  Filled 2019-02-10: qty 2
  Filled 2019-02-10: qty 1
  Filled 2019-02-10 (×2): qty 2

## 2019-02-10 MED ORDER — ADULT MULTIVITAMIN W/MINERALS CH
1.0000 | ORAL_TABLET | Freq: Every day | ORAL | Status: DC
  Administered 2019-02-11 – 2019-02-14 (×4): 1 via ORAL
  Filled 2019-02-10 (×4): qty 1

## 2019-02-10 MED ORDER — POLYETHYLENE GLYCOL 3350 17 G PO PACK: 17.0000 g | PACK | Freq: Every day | ORAL | Status: DC | PRN

## 2019-02-10 MED ORDER — LEVETIRACETAM 100 MG/ML PO SOLN
750.0000 mg | Freq: Two times a day (BID) | ORAL | Status: DC
  Administered 2019-02-10 – 2019-02-14 (×8): 750 mg via ORAL
  Filled 2019-02-10 (×8): qty 10

## 2019-02-10 MED ORDER — SODIUM CHLORIDE 1 G PO TABS
3.0000 g | ORAL_TABLET | Freq: Three times a day (TID) | ORAL | Status: DC
  Administered 2019-02-10 – 2019-02-14 (×12): 3 g via ORAL
  Filled 2019-02-10 (×13): qty 3

## 2019-02-10 MED ORDER — ENOXAPARIN SODIUM 30 MG/0.3ML ~~LOC~~ SOLN: 30.0000 mg | Freq: Two times a day (BID) | SUBCUTANEOUS | Status: DC

## 2019-02-10 MED ORDER — ACETAMINOPHEN 325 MG PO TABS
325.0000 mg | ORAL_TABLET | ORAL | Status: DC | PRN
  Administered 2019-02-10 – 2019-02-13 (×3): 650 mg via ORAL
  Filled 2019-02-10 (×3): qty 2

## 2019-02-10 MED ORDER — FLEET ENEMA 7-19 GM/118ML RE ENEM: 1.0000 | ENEMA | Freq: Once | RECTAL | Status: DC | PRN

## 2019-02-10 MED ORDER — CHLORHEXIDINE GLUCONATE CLOTH 2 % EX PADS: 6.0000 | MEDICATED_PAD | Freq: Every day | CUTANEOUS | Status: DC

## 2019-02-10 MED ORDER — ENOXAPARIN SODIUM 30 MG/0.3ML ~~LOC~~ SOLN
30.0000 mg | Freq: Two times a day (BID) | SUBCUTANEOUS | Status: DC
  Administered 2019-02-10 – 2019-02-14 (×8): 30 mg via SUBCUTANEOUS
  Filled 2019-02-10 (×8): qty 0.3

## 2019-02-10 MED ORDER — PROCHLORPERAZINE 25 MG RE SUPP: 12.5000 mg | Freq: Four times a day (QID) | RECTAL | Status: DC | PRN

## 2019-02-10 MED ORDER — PROCHLORPERAZINE EDISYLATE 10 MG/2ML IJ SOLN: 5.0000 mg | Freq: Four times a day (QID) | INTRAMUSCULAR | Status: DC | PRN

## 2019-02-10 NOTE — Progress Notes (Signed)
Report called to Willey Blade RN rehab Nurse. Patient is alert and agreeable to transfer

## 2019-02-10 NOTE — Progress Notes (Signed)
Removed staples from craniotomy site, incision intact. No noted drainage or bleeding incision pink in color. No openings noted to wound. Patient tolerated well. No c/o pain to area

## 2019-02-10 NOTE — Plan of Care (Signed)
Problem: Health Behavior/Discharge Planning: Goal: Ability to manage health-related needs will improve Outcome: Adequate for Discharge   Problem: Clinical Measurements: Goal: Ability to maintain clinical measurements within normal limits will improve Outcome: Adequate for Discharge Goal: Will remain free from infection Outcome: Adequate for Discharge Goal: Diagnostic test results will improve Outcome: Adequate for Discharge Goal: Respiratory complications will improve Outcome: Adequate for Discharge Goal: Cardiovascular complication will be avoided Outcome: Adequate for Discharge   Problem: Activity: Goal: Risk for activity intolerance will decrease Outcome: Adequate for Discharge   Problem: Nutrition: Goal: Adequate nutrition will be maintained Outcome: Adequate for Discharge   Problem: Elimination: Goal: Will not experience complications related to bowel motility 02/10/2019 1441 by Erling Conte, RN Outcome: Adequate for Discharge 02/10/2019 1440 by Erling Conte, RN Outcome: Progressing Goal: Will not experience complications related to urinary retention 02/10/2019 1441 by Erling Conte, RN Outcome: Adequate for Discharge 02/10/2019 1440 by Erling Conte, RN Outcome: Progressing   Problem: Pain Managment: Goal: General experience of comfort will improve 02/10/2019 1441 by Erling Conte, RN Outcome: Adequate for Discharge 02/10/2019 1440 by Erling Conte, RN Outcome: Progressing   Problem: Safety: Goal: Ability to remain free from injury will improve Outcome: Adequate for Discharge   Problem: Respiratory: Goal: Ability to maintain a clear airway and adequate ventilation will improve 02/10/2019 1441 by Erling Conte, RN Outcome: Adequate for Discharge 02/10/2019 1440 by Erling Conte, RN Outcome: Progressing   Problem: Education: Goal: Knowledge of the prescribed therapeutic regimen will improve 02/10/2019 1441 by Erling Conte, RN Outcome: Adequate for Discharge 02/10/2019 1440 by Erling Conte, RN Outcome: Progressing   Problem: Clinical Measurements: Goal: Usual level of consciousness will be regained or maintained. 02/10/2019 1441 by Erling Conte, RN Outcome: Adequate for Discharge 02/10/2019 1440 by Erling Conte, RN Outcome: Progressing Goal: Neurologic status will improve 02/10/2019 1441 by Erling Conte, RN Outcome: Adequate for Discharge 02/10/2019 1440 by Erling Conte, RN Outcome: Progressing Goal: Ability to maintain intracranial pressure will improve 02/10/2019 1441 by Erling Conte, RN Outcome: Adequate for Discharge 02/10/2019 1440 by Erling Conte, RN Outcome: Progressing   Problem: Skin Integrity: Goal: Demonstration of wound healing without infection will improve 02/10/2019 1441 by Erling Conte, RN Outcome: Adequate for Discharge 02/10/2019 1440 by Erling Conte, RN Outcome: Progressing   Problem: Education: Goal: Knowledge of General Education information will improve Description: Including pain rating scale, medication(s)/side effects and non-pharmacologic comfort measures Outcome: Adequate for Discharge   Problem: Health Behavior/Discharge Planning: Goal: Ability to manage health-related needs will improve 02/10/2019 1441 by Erling Conte, RN Outcome: Adequate for Discharge 02/10/2019 1440 by Erling Conte, RN Outcome: Progressing   Problem: Clinical Measurements: Goal: Ability to maintain clinical measurements within normal limits will improve 02/10/2019 1441 by Erling Conte, RN Outcome: Adequate for Discharge 02/10/2019 1440 by Erling Conte, RN Outcome: Progressing Goal: Will remain free from infection 02/10/2019 1441 by Erling Conte, RN Outcome: Adequate for Discharge 02/10/2019 1440 by Erling Conte, RN Outcome: Progressing Goal: Diagnostic test results will improve 02/10/2019 1441 by  Erling Conte, RN Outcome: Adequate for Discharge 02/10/2019 1440 by Erling Conte, RN Outcome: Progressing Goal: Respiratory complications will improve 02/10/2019 1441 by Erling Conte, RN Outcome: Adequate for Discharge 02/10/2019 1440 by Erling Conte, RN Outcome: Progressing Goal: Cardiovascular complication will be avoided 02/10/2019 1441 by Erling Conte, RN Outcome: Adequate for Discharge 02/10/2019  1440 by Dareen Piano, RN Outcome: Progressing   Problem: Activity: Goal: Risk for activity intolerance will decrease 02/10/2019 1441 by Dareen Piano, RN Outcome: Adequate for Discharge 02/10/2019 1440 by Dareen Piano, RN Outcome: Progressing   Problem: Nutrition: Goal: Adequate nutrition will be maintained 02/10/2019 1441 by Dareen Piano, RN Outcome: Adequate for Discharge 02/10/2019 1440 by Dareen Piano, RN Outcome: Progressing   Problem: Coping: Goal: Level of anxiety will decrease 02/10/2019 1441 by Dareen Piano, RN Outcome: Adequate for Discharge 02/10/2019 1440 by Dareen Piano, RN Outcome: Progressing   Problem: Elimination: Goal: Will not experience complications related to bowel motility 02/10/2019 1441 by Dareen Piano, RN Outcome: Adequate for Discharge 02/10/2019 1440 by Dareen Piano, RN Outcome: Progressing Goal: Will not experience complications related to urinary retention 02/10/2019 1441 by Dareen Piano, RN Outcome: Adequate for Discharge 02/10/2019 1440 by Dareen Piano, RN Outcome: Progressing   Problem: Pain Managment: Goal: General experience of comfort will improve 02/10/2019 1441 by Dareen Piano, RN Outcome: Adequate for Discharge 02/10/2019 1440 by Dareen Piano, RN Outcome: Progressing   Problem: Safety: Goal: Ability to remain free from injury will improve 02/10/2019 1441 by Dareen Piano, RN Outcome: Adequate for Discharge 02/10/2019 1440 by  Dareen Piano, RN Outcome: Progressing   Problem: Skin Integrity: Goal: Risk for impaired skin integrity will decrease 02/10/2019 1441 by Dareen Piano, RN Outcome: Adequate for Discharge 02/10/2019 1440 by Dareen Piano, RN Outcome: Progressing
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
## 2019-02-10 NOTE — Progress Notes (Signed)
Inpatient Rehab Admissions Coordinator:   I have a bed available for pt to transfer to CIR today. Mattie Marlin, PA with trauma, in agreement.  Will let pt/family and CM know.   Estill Dooms, PT, DPT Admissions Coordinator [phone] 02/10/19  10:55 AM

## 2019-02-10 NOTE — Progress Notes (Addendum)
CRITICAL VALUE STICKER  CRITICAL VALUE: platelet count [phone]  RECEIVER (on-site recipient of call): Susan Wilkinson  DATE & TIME NOTIFIED: 02/10/19 at 0620  MESSENGER (representative from lab):  MD NOTIFIED: Dr. Cliffton Asters  TIME OF NOTIFICATION: 0640  RESPONSE: Will address during rounds today, continue to monitor.   Critical platelet level called to unit this morning. Patient is doing fine without s/s bleeding/pain/discomfort.  To alert the oncoming nurse to be alert.

## 2019-02-10 NOTE — Progress Notes (Signed)
Report was called to Rehab Nurse Willey Blade RN
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
## 2019-02-11 ENCOUNTER — Inpatient Hospital Stay (HOSPITAL_COMMUNITY): Payer: Medicaid Other | Admitting: Speech Pathology

## 2019-02-11 ENCOUNTER — Inpatient Hospital Stay (HOSPITAL_COMMUNITY): Payer: Medicaid Other

## 2019-02-11 ENCOUNTER — Inpatient Hospital Stay (HOSPITAL_COMMUNITY): Payer: No Typology Code available for payment source

## 2019-02-11 ENCOUNTER — Inpatient Hospital Stay (HOSPITAL_COMMUNITY): Payer: Medicaid Other | Admitting: Physical Therapy

## 2019-02-11 DIAGNOSIS — S065X9A Traumatic subdural hemorrhage with loss of consciousness of unspecified duration, initial encounter: Secondary | ICD-10-CM

## 2019-02-11 DIAGNOSIS — E871 Hypo-osmolality and hyponatremia: Secondary | ICD-10-CM

## 2019-02-11 DIAGNOSIS — D62 Acute posthemorrhagic anemia: Secondary | ICD-10-CM

## 2019-02-11 DIAGNOSIS — S72401S Unspecified fracture of lower end of right femur, sequela: Secondary | ICD-10-CM

## 2019-02-11 LAB — CBC WITH DIFFERENTIAL/PLATELET
Abs Immature Granulocytes: 0.11 10*3/uL — ABNORMAL HIGH (ref 0.00–0.07)
Basophils Absolute: 0.1 10*3/uL (ref 0.0–0.1)
Basophils Relative: 1 %
Eosinophils Absolute: 0.2 10*3/uL (ref 0.0–0.5)
Eosinophils Relative: 2 %
HCT: 34.2 % — ABNORMAL LOW (ref 36.0–46.0)
Hemoglobin: 10.5 g/dL — ABNORMAL LOW (ref 12.0–15.0)
Immature Granulocytes: 1 %
Lymphocytes Relative: 24 %
Lymphs Abs: 3 10*3/uL (ref 0.7–4.0)
MCH: 26.6 pg (ref 26.0–34.0)
MCHC: 30.7 g/dL (ref 30.0–36.0)
MCV: 86.6 fL (ref 80.0–100.0)
Monocytes Absolute: 0.7 10*3/uL (ref 0.1–1.0)
Monocytes Relative: 5 %
Neutro Abs: 8.7 10*3/uL — ABNORMAL HIGH (ref 1.7–7.7)
Neutrophils Relative %: 67 %
Platelets: 966 10*3/uL (ref 150–400)
RBC: 3.95 MIL/uL (ref 3.87–5.11)
RDW: 19.6 % — ABNORMAL HIGH (ref 11.5–15.5)
WBC: 12.8 10*3/uL — ABNORMAL HIGH (ref 4.0–10.5)
nRBC: 0 % (ref 0.0–0.2)

## 2019-02-11 LAB — COMPREHENSIVE METABOLIC PANEL
ALT: 15 U/L (ref 0–44)
AST: 30 U/L (ref 15–41)
Albumin: 3.3 g/dL — ABNORMAL LOW (ref 3.5–5.0)
Alkaline Phosphatase: 190 U/L — ABNORMAL HIGH (ref 38–126)
Anion gap: 13 (ref 5–15)
BUN: 15 mg/dL (ref 6–20)
CO2: 19 mmol/L — ABNORMAL LOW (ref 22–32)
Calcium: 8.8 mg/dL — ABNORMAL LOW (ref 8.9–10.3)
Chloride: 101 mmol/L (ref 98–111)
Creatinine, Ser: 0.61 mg/dL (ref 0.44–1.00)
GFR calc Af Amer: >60 mL/min (ref >60)
GFR calc non Af Amer: >60 mL/min (ref >60)
Glucose, Bld: 160 mg/dL — ABNORMAL HIGH (ref 70–99)
Potassium: 4.3 mmol/L (ref 3.5–5.1)
Sodium: 133 mmol/L — ABNORMAL LOW (ref 135–145)
Total Bilirubin: 0.9 mg/dL (ref 0.3–1.2)
Total Protein: 7.2 g/dL (ref 6.5–8.1)

## 2019-02-11 MED ORDER — ENSURE ENLIVE PO LIQD
237.0000 mL | Freq: Two times a day (BID) | ORAL | Status: DC
  Administered 2019-02-11 – 2019-02-14 (×7): 237 mL via ORAL

## 2019-02-11 MED ORDER — METHOCARBAMOL 500 MG PO TABS
500.0000 mg | ORAL_TABLET | Freq: Four times a day (QID) | ORAL | Status: DC | PRN
  Administered 2019-02-11 – 2019-02-14 (×9): 500 mg via ORAL
  Filled 2019-02-11 (×10): qty 1

## 2019-02-11 NOTE — Progress Notes (Signed)
Lower extremity venous has been completed.   Preliminary results in CV Proc.   Susan Wilkinson 02/11/2019 10:12 AM

## 2019-02-11 NOTE — Progress Notes (Signed)
Inpatient Rehabilitation  Patient information reviewed and entered into eRehab system by Arbutus Nelligan M. Yuna Pizzolato, M.A., CCC/SLP, PPS Coordinator.  Information including medical coding, functional ability and quality indicators will be reviewed and updated through discharge.    

## 2019-02-11 NOTE — Progress Notes (Signed)
Orthopedic Tech Progress Note Patient Details:  Susan Wilkinson [birth year] [id number] Reapplied cpm to patient RLE. Patient ID: Susan Wilkinson, female   DOB: [birth year], 24 y.o.   MRN: [id number]   Susan Wilkinson 02/11/2019, 7:54 PM
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
## 2019-02-11 NOTE — Progress Notes (Signed)
Pt slept on and off throughout the night, pt used call light to request assistance to the bathroom, Helmet in place while out of bed. Pt instructed no weight bearing to right leg. Pt c/o pain at 0500 of abd pain/cramping. PRN pain med provided. Pt stated she was not sure if pain felt like she needed to have a BM or uterine cramping. No signs of distress at this time, Call light in reach.
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
## 2019-02-12 ENCOUNTER — Inpatient Hospital Stay (HOSPITAL_COMMUNITY): Payer: Medicaid Other | Admitting: Physical Therapy

## 2019-02-12 ENCOUNTER — Inpatient Hospital Stay (HOSPITAL_COMMUNITY): Payer: Medicaid Other

## 2019-02-12 ENCOUNTER — Inpatient Hospital Stay (HOSPITAL_COMMUNITY): Payer: Medicaid Other | Admitting: Speech Pathology

## 2019-02-12 ENCOUNTER — Encounter (HOSPITAL_COMMUNITY): Payer: Medicaid Other | Admitting: Psychology

## 2019-02-12 DIAGNOSIS — F1910 Other psychoactive substance abuse, uncomplicated: Secondary | ICD-10-CM

## 2019-02-12 DIAGNOSIS — S069X3S Unspecified intracranial injury with loss of consciousness of 1 hour to 5 hours 59 minutes, sequela: Secondary | ICD-10-CM

## 2019-02-12 LAB — PATHOLOGIST SMEAR REVIEW

## 2019-02-12 NOTE — Progress Notes (Addendum)
Patient completed 3.45 hours of CPM to RLE from 1945 to 2230 last night. Pt slept mostly through the night. Pt used call light when requesting to go to the bathroom or PRN medications. Helmet in place when out of bed. Call light in reach. No signs of distress at this time.
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
## 2019-02-12 NOTE — Progress Notes (Signed)
Physical Therapy Session Note  Patient Details  Name: Susan Wilkinson MRN: [id number] Date of Birth: Mar 06, 1995  Today's Date: 02/12/2019 PT Individual Time: 1510-1525 PT Individual Time Calculation (min): 15 min   Short Term Goals: Week 1:  PT Short Term Goal 1 (Week 1): =LTGs due to ELOS  Skilled Therapeutic Interventions/Progress Updates:   Pt in supine, on phone and tearful. Pt requesting therapist return. Therapist returned 20 min later, pt off phone and agreeable to therapy. Pt reports pain 10/10 in ribs. Pt grimacing and moaning in pain throughout session. Made RN aware of pain and provided w/ hot pack until RN could retrieve pain medication. Pt agreeable to bed level exercise, therapist provided passive R hamstring stretch, 1 min hold x3. Pt performed contract/relax quad sets 2x5 w/ decreased stiffness at end range w/ R knee extension after. Discussed importance of performing RLE movements throughout day to slowly build strength and increase flexibility. Pt independent w/ both quad set, ankle pumps, and short range abduction/adduction heel slide. Instructed to perform often outside of therapy, multiple times per day. Pt verbalized understanding and in agreement. Ended session in supine, all needs in reach. Missed 30 min of skilled PT in total 2/2 pain and refusal.   Therapy Documentation Precautions:  Precautions Precautions: Fall Precaution Comments: helmet Restrictions Weight Bearing Restrictions: Yes RUE Weight Bearing: Weight bearing as tolerated LUE Weight Bearing: Weight bearing as tolerated RLE Weight Bearing: Non weight bearing General: PT Amount of Missed Time (min): 30 Minutes PT Missed Treatment Reason: Pain;Patient unwilling to participate Vital Signs: Therapy Vitals Temp: 98.4 F (36.9 C) Pulse Rate: 95 Resp: 18 BP: 111/67 Patient Position (if appropriate): Lying Oxygen Therapy SpO2: 100 % O2 Device: Room Air Pain: Pain Assessment Pain Scale: 0-10 Pain  Score: 3  Pain Type: Surgical pain Pain Location: Knee Pain Orientation: Right Pain Descriptors / Indicators: Aching Pain Frequency: Constant Pain Onset: On-going Pain Intervention(s): Medication (See eMAR)  Therapy/Group: Individual Therapy  Eitan Doubleday Melton Krebs 02/12/2019, 5:48 PM

## 2019-02-12 NOTE — Progress Notes (Signed)
Physical Therapy Session Note  Patient Details  Name: Susan Wilkinson MRN: [id number] Date of Birth: July 05, 1995  Today's Date: 02/12/2019 PT Individual Time: 1303-1330 PT Individual Time Calculation (min): 27 min   Short Term Goals: Week 1:  PT Short Term Goal 1 (Week 1): =LTGs due to ELOS  Skilled Therapeutic Interventions/Progress Updates: Pt presented in bed completing lunch and on phone. Pt agreeable to therapy. Pt indicating need for bathroom. Performed squat pivot transfer to w/c CGA with good adherence to NWB restrictions. PTA pushed w/c to toilet and pt performed stand pivot with wall rail to toilet CGA and was able to pull pants down close S (+BM). Pt returned to w/c in same manner as prior and transported to sink to perform hand hygiene. Pt required increased time to initiate activity but was able to perform mod I. Pt then indicated increased pain in L ribs since BM 8/10, unable to receive additional meds per nsg. Pt transported to day room and performed squat pivot to mat CGA. Performed STS x 5 from lowered mat, CGA with pt able to maintain NWB precautions and on last stand did not require use of UE. On descent encouraged pt to use RUE vs L for pain management with pt stating some relief of pain. Pt was able to perform stand pivot with RW CGA with pt sliding L foot so as to decrease pressure of BUE on RW (vs hopping). Pt transported to back to room and performed squat pivot to bed CGA. With increased time pt transferred to long sit on bed. Pt left in long sitting in bed with bed alarm on, call bell within reach and needs met.      Therapy Documentation Precautions:  Precautions Precautions: Fall Precaution Comments: helmet Restrictions Weight Bearing Restrictions: Yes RUE Weight Bearing: Weight bearing as tolerated LUE Weight Bearing: Weight bearing as tolerated RLE Weight Bearing: Non weight bearing General:   Vital Signs:   Pain: Pain Assessment Pain Scale: 0-10 Pain Score: 7   Pain Type: Surgical pain Pain Location: Knee Pain Orientation: Right Pain Descriptors / Indicators: Aching Pain Frequency: Constant Pain Intervention(s): Medication (See eMAR)   Therapy/Group: Individual Therapy  Saivon Prowse  Graig Hessling, PTA  02/12/2019, 1:52 PM

## 2019-02-12 NOTE — Progress Notes (Signed)
Orthopedic Tech Progress Note Patient Details:  Susan Wilkinson 1995/08/13 [id number] RN called requesting patient to be put on CPM machine  Patient ID: Susan Wilkinson, female   DOB: 1995/05/09, 24 y.o.   MRN: [id number]   Donald Pore 02/12/2019, 7:53 PM

## 2019-02-12 NOTE — Progress Notes (Signed)
Occupational Therapy Session Note  Patient Details  Name: Susan Wilkinson MRN: [id number] Date of Birth: 05-20-1995  Today's Date: 02/12/2019 OT Individual Time: 0233-4356 OT Individual Time Calculation (min): 55 min    Short Term Goals: Week 1:  OT Short Term Goal 1 (Week 1): STG= LTG d/t ELOS  Skilled Therapeutic Interventions/Progress Updates:    Pt received supine c/o pain in her RLE, waiting for medication in 20 min. Pt completed bed mobility to EOB with CGA, requiring self management of RLE. Discussed CPM use and stretching of R LE. Pt completed stand pivot transfer to the w/c with min A. Pt completed oral care with min cueing for initiation, no set up required. Pt completed UB bathing and dressing, donning and doffing shirt with CGA. Pt cued for compensatory technique to decrease pain with higher shoulder flexion (pulling overhead). Pt declined LB bathing. Pt propelled her w/c 100 ft to the dayroom with increased time and only min cueing for technique. Discussed d/c planning with PT and then with pt. Pt reported wanting to start medicine to help with decreasing heroin use- discussed home support and engagement in IADLs to support this goal. Pt completed standing level pipe tree puzzle to challenge standing balance with RLE NWB and to challenge dual processing/problem solving. 1 LOB in standing, pt able to recover with UE support on table and CGA. Pt completed 10 ft of functional mobility with the RW with min A. Pt was returned to her room via w/c, RN entering to deliver medication. All needs met, chair alarm set.   Therapy Documentation Precautions:  Precautions Precautions: Fall Precaution Comments: helmet Restrictions Weight Bearing Restrictions: Yes RUE Weight Bearing: Weight bearing as tolerated LUE Weight Bearing: Weight bearing as tolerated RLE Weight Bearing: Non weight bearing   Therapy/Group: Individual Therapy  Curtis Sites 02/12/2019, 6:46 AM

## 2019-02-12 NOTE — Progress Notes (Signed)
Speech Language Pathology Daily Session Note  Patient Details  Name: Susan Wilkinson MRN: [id number] Date of Birth: [birth year]  Today's Date: 02/12/2019 SLP Individual Time: 0730-0825 SLP Individual Time Calculation (min): 55 min  Short Term Goals: Week 1: SLP Short Term Goal 1 (Week 1): STGS=LTGs due to ELOS  Skilled Therapeutic Interventions: Skilled treatment session focused on cognitive goals. SLP facilitated session by providing Mod A verbal cues for complex problem solving during a complex, novel task. Suspect function was impacted by pain but patient recalled times of previous pain medications and requested appropriate medications independently. Patent left upright in bed with alarm on and all needs within reach. Continue with current plan of care.     Pain Pain Assessment Pain Scale: 0-10 Pain Score: 7  Pain Type: Surgical pain Pain Location: Knee Pain Orientation: Right Pain Descriptors / Indicators: Aching Pain Frequency: Constant Pain Onset: On-going Pain Intervention(s): Medication (See eMAR)  Therapy/Group: Individual Therapy  Pernie Grosso 02/12/2019, 11:06 AM
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
## 2019-02-12 NOTE — Plan of Care (Signed)
Behavioral Plan   Rancho Level: VII - automatic/appropriate  Behavior to decrease/ eliminate:  -no inappropriate behaviors noted at this time   Changes to environment:  -n/a  Interventions: -continue use of safety alarm belt and helmet use when OOB  Recommendations for interactions with patient: -n/a  Attendees:  Feliberto Gottron, SLP Carlynn Purl, PT Jake Shark, OT

## 2019-02-13 ENCOUNTER — Inpatient Hospital Stay (HOSPITAL_COMMUNITY): Payer: Medicaid Other

## 2019-02-13 ENCOUNTER — Inpatient Hospital Stay (HOSPITAL_COMMUNITY): Payer: Medicaid Other | Admitting: Speech Pathology

## 2019-02-13 ENCOUNTER — Inpatient Hospital Stay (HOSPITAL_COMMUNITY): Payer: Medicaid Other | Admitting: Physical Therapy

## 2019-02-13 LAB — BASIC METABOLIC PANEL
Anion gap: 12 (ref 5–15)
BUN: 11 mg/dL (ref 6–20)
CO2: 23 mmol/L (ref 22–32)
Calcium: 9.2 mg/dL (ref 8.9–10.3)
Chloride: 103 mmol/L (ref 98–111)
Creatinine, Ser: 0.4 mg/dL — ABNORMAL LOW (ref 0.44–1.00)
GFR calc Af Amer: >60 mL/min (ref >60)
GFR calc non Af Amer: >60 mL/min (ref >60)
Glucose, Bld: 95 mg/dL (ref 70–99)
Potassium: 4.2 mmol/L (ref 3.5–5.1)
Sodium: 138 mmol/L (ref 135–145)

## 2019-02-13 MED ORDER — ENOXAPARIN SODIUM 40 MG/0.4ML ~~LOC~~ SOLN: 40.0000 mg | SUBCUTANEOUS | 0 refills | Status: DC

## 2019-02-13 MED ORDER — METHOCARBAMOL 500 MG PO TABS: 500.0000 mg | ORAL_TABLET | Freq: Four times a day (QID) | ORAL | 0 refills | Status: DC | PRN

## 2019-02-13 MED ORDER — ADULT MULTIVITAMIN W/MINERALS CH: 1.0000 | ORAL_TABLET | Freq: Every day | ORAL | 0 refills | Status: DC

## 2019-02-13 MED ORDER — SODIUM CHLORIDE 1 G PO TABS: 3.0000 g | ORAL_TABLET | Freq: Two times a day (BID) | ORAL | 0 refills | Status: DC

## 2019-02-13 MED ORDER — VITAMIN D3 25 MCG PO TABS: 2000.0000 [IU] | ORAL_TABLET | Freq: Two times a day (BID) | ORAL | 0 refills | Status: DC

## 2019-02-13 MED ORDER — ACETAMINOPHEN 325 MG PO TABS: 325.0000 mg | ORAL_TABLET | ORAL | 0 refills | Status: DC | PRN

## 2019-02-13 MED ORDER — OXYCODONE HCL ER 10 MG PO T12A
10.0000 mg | EXTENDED_RELEASE_TABLET | Freq: Every day | ORAL | Status: DC
  Administered 2019-02-14: 10 mg via ORAL
  Filled 2019-02-13: qty 1

## 2019-02-13 MED ORDER — LEVETIRACETAM 750 MG PO TABS: 750.0000 mg | ORAL_TABLET | Freq: Two times a day (BID) | ORAL | 1 refills | Status: DC

## 2019-02-13 MED ORDER — TRAZODONE HCL 50 MG PO TABS: 50.0000 mg | ORAL_TABLET | Freq: Every evening | ORAL | 0 refills | Status: AC | PRN

## 2019-02-13 MED ORDER — OXYCODONE HCL 5 MG PO TABS: 5.0000 mg | ORAL_TABLET | Freq: Four times a day (QID) | ORAL | 0 refills | Status: DC | PRN

## 2019-02-13 MED ORDER — ASCORBIC ACID 250 MG PO TABS: 250.0000 mg | ORAL_TABLET | Freq: Two times a day (BID) | ORAL | 0 refills | Status: DC

## 2019-02-13 MED FILL — ACETAMINOPHEN 325 MG TABS: 325 | 9 days supply | Qty: 100 | Fill #0

## 2019-02-13 MED FILL — VITAMIN D3 1,000 UNIT TAB: 25 MCG | 25 days supply | Qty: 100 | Fill #0

## 2019-02-13 MED FILL — ENOXAPARIN SODIUM 40 MG/0.4: 40 | 30 days supply | Qty: 12 | Fill #0

## 2019-02-13 MED FILL — METHOCARBAMOL 500 MG TABS: 500 | 15 days supply | Qty: 60 | Fill #0

## 2019-02-13 MED FILL — levETIRAcetam 750 MG TABS: 750 | 30 days supply | Qty: 60 | Fill #0

## 2019-02-13 MED FILL — VITAMIN C 500 MG TABLET: 500 | 50 days supply | Qty: 50 | Fill #0

## 2019-02-13 MED FILL — oxyCODONE HCL 5 MG TABS: 5 | 5 days supply | Qty: 40 | Fill #0

## 2019-02-13 MED FILL — traZODone HCL 50 MG TABS: 50 | 30 days supply | Qty: 30 | Fill #0

## 2019-02-13 MED FILL — SODIUM CHLORIDE 1 GM TABS: 1 | 10 days supply | Qty: 60 | Fill #0

## 2019-02-13 NOTE — Progress Notes (Signed)
Occupational Therapy Session Note  Patient Details  Name: Susan Wilkinson MRN: [id number] Date of Birth: July 21, 1995  Today's Date: 02/13/2019 OT Individual Time: 1000-1058 OT Individual Time Calculation (min): 58 min    Short Term Goals: Week 1:  OT Short Term Goal 1 (Week 1): STG= LTG d/t ELOS  Skilled Therapeutic Interventions/Progress Updates:    1:1. Pt agreeable to bathing and dressing this date. Pt completes stand pivot transfers throughout session with S and VC for direction of pivot to pivot towards the chair despite if it is NWB LE or not. Pt completes bathing seated on Shower chair in bathroom with S with VC for lateral leans to wash buttocks. Pt dresses with set up for UB and S for LB with 3 LOB laterally when advancing patns past hips, but pt able to steady self on sink each LOB without A. Pt requires VC for only using 1UE at a time to advance past hips. Pt completes oral care with set up and educated onTTB transfer in ADL apartment. Pt able to perform with S. Exited session with pt seated in bed, exit alarm on and call light in reach.  Therapy Documentation Precautions:  Precautions Precautions: Fall Precaution Comments: helmet Restrictions Weight Bearing Restrictions: Yes RUE Weight Bearing: Weight bearing as tolerated LUE Weight Bearing: Weight bearing as tolerated RLE Weight Bearing: Non weight bearing General:   Vital Signs:  Pain: Pain Assessment Pain Scale: 0-10 Pain Score: 7  Pain Type: Surgical pain Pain Location: Knee Pain Orientation: Right Pain Descriptors / Indicators: Aching;Sore Pain Frequency: Constant Pain Onset: On-going Pain Intervention(s): Medication (See eMAR) ADL:   Vision   Perception    Praxis   Exercises:   Other Treatments:     Therapy/Group: Individual Therapy  Shon Hale 02/13/2019, 10:53 AM

## 2019-02-13 NOTE — Progress Notes (Signed)
Occupational Therapy Discharge Summary  Patient Details  Name: Susan Wilkinson MRN: [id number] Date of Birth: February 21, 1995  Today's Date: 02/14/2019 OT Individual Time: 9509-3267 OT Individual Time Calculation (min): 40 min     1:1. Pt received in bed agreeable to OT with mother present for family ed. OT reviews use of routines, energy conservation strategies and needed supervision for cognition and balance tasks. Pt demo stand pivot transfers to/from w/c/toilet and OT problem solves with family to use shower chair in tub. Pt able to abide by WB precautions by hopping into bathroom with RW, sit onto tub ledge, manage BLE over tub ledge and push up into sitting on shower chair. Edu re lowering height of shower chair to make it an easier transition. Able to return demo with mother providing superviison. Edu re HEP with theraband reviewed and no questions via family. Mother aware to supervise ADLs and be close for standing balance during toileting/dressing tasks. Exited session with pt seated in w/c, call light in reach and all needs met   Patient has met 9 of 9 long term goals due to improved activity tolerance, improved balance, ability to compensate for deficits, improved attention, improved awareness and improved coordination.  Patient to discharge at overall Supervision level. Mother present for family education and pt given HEP for further UE strengthening Patient's care partner is independent to provide the necessary cognitive assistance at discharge.    Reasons goals not met: n/a  Equipment: shower chair  Reasons for discharge: treatment goals met and discharge from hospital  Patient/family agrees with progress made and goals achieved: Yes  OT Discharge Precautions/Restrictions  Precautions Precautions: Fall Precaution Comments: helmet Restrictions Weight Bearing Restrictions: Yes RUE Weight Bearing: Weight bearing as tolerated LUE Weight Bearing: Weight bearing as tolerated RLE Weight  Bearing: Non weight bearing General   Vital Signs  Pain Pain Assessment Pain Scale: 0-10 Pain Score: 6  Pain Type: Other (Comment)(medicated for suture removal) Pain Location: Knee Pain Orientation: Right Pain Descriptors / Indicators: Aching;Sore Pain Frequency: Constant Pain Onset: On-going Pain Intervention(s): Medication (See eMAR) ADL ADL Eating: Supervision/safety Grooming: Supervision/safety Upper Body Bathing: Supervision/safety Lower Body Bathing: Supervision/safety Upper Body Dressing: Supervision/safety Lower Body Dressing: Supervision/safety Toileting: Supervision/safety Toilet Transfer: Close supervision Toilet Transfer Method: Stand pivot Tub/Shower Transfer: Close supervison Tub/Shower Equipment: Facilities manager: Close supervision Social research officer, government Method: Radiographer, therapeutic: Civil engineer, contracting with back Vision Baseline Vision/History: No visual deficits Patient Visual Report: No change from baseline Vision Assessment?: Yes Eye Alignment: Within Functional Limits Ocular Range of Motion: Within Functional Limits Alignment/Gaze Preference: Within Defined Limits Tracking/Visual Pursuits: Able to track stimulus in all quads without difficulty Saccades: Within functional limits Convergence: Within functional limits Perception  Perception: Within Functional Limits Praxis Praxis: Intact Cognition Overall Cognitive Status: Impaired/Different from baseline Arousal/Alertness: Awake/alert Orientation Level: Oriented X4 Attention: Sustained Sustained Attention: Appears intact Sustained Attention Impairment: Functional complex Selective Attention: Appears intact Memory: Impaired Sensation Sensation Light Touch: Impaired Detail Central sensation comments: reports some numbness/tingling in the R knee but otherwise WNL Hot/Cold: Appears Intact Proprioception: Appears Intact Coordination Gross Motor Movements are  Fluid and Coordinated: No Fine Motor Movements are Fluid and Coordinated: No Coordination and Movement Description: generalized weakness and limited by pain Finger Nose Finger Test: Slight dysmetria in the RUE, LUE WFL Heel Shin Test: RLE unable, LLE mild dysmetria and decreased range Motor  Motor Motor: Within Functional Limits Motor - Skilled Clinical Observations: generalized weakness Mobility  Bed Mobility Bed  Mobility: Rolling Right;Rolling Left;Sit to Supine;Supine to Sit Rolling Right: Supervision/verbal cueing Rolling Left: Supervision/Verbal cueing Supine to Sit: Supervision/Verbal cueing Sit to Supine: Supervision/Verbal cueing Transfers Sit to Stand: Supervision/Verbal cueing Stand to Sit: Supervision/Verbal cueing  Trunk/Postural Assessment  Cervical Assessment Cervical Assessment: Within Functional Limits Thoracic Assessment Thoracic Assessment: Within Functional Limits Lumbar Assessment Lumbar Assessment: Within Functional Limits Postural Control Postural Control: Within Functional Limits  Balance Static Sitting Balance Static Sitting - Level of Assistance: 6: Modified independent (Device/Increase time) Dynamic Sitting Balance Dynamic Sitting - Level of Assistance: 6: Modified independent (Device/Increase time) Static Standing Balance Static Standing - Level of Assistance: 5: Stand by assistance Dynamic Standing Balance Dynamic Standing - Level of Assistance: 5: Stand by assistance Extremity/Trunk Assessment RUE Assessment RUE Assessment: Exceptions to [facility] Active Range of Motion (AROM) Comments: Slightly impaired full shoulder flexion, ~150 degrees of active movement General Strength Comments: Limited by pain LUE Assessment LUE Assessment: Exceptions to [facility] Active Range of Motion (AROM) Comments: Slightly impaired shoulder flexion General Strength Comments: limited by pain   Tonny Branch 02/13/2019, 12:14 PM
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
## 2019-02-13 NOTE — Progress Notes (Signed)
Physical Therapy Session Note  Patient Details  Name: Susan Wilkinson MRN: [id number] Date of Birth: 09-09-1995  Today's Date: 02/13/2019 PT Individual Time: 0850-0930 PT Individual Time Calculation (min): 40 min   Short Term Goals: Week 1:  PT Short Term Goal 1 (Week 1): =LTGs due to ELOS  Skilled Therapeutic Interventions/Progress Updates:   Pt in supine and agreeable to therapy, pain 6/10 in RLE (premedicated). Discussed readiness for d/c as pt is eager to go home. Pt performed bed mobility independently and donned pants w/ supervision in long sitting - verbal cues for technique, to thread RLE first. Sit>stand to RW w/ supervision and pt pulled pants over hips w/o assist. Stand pivot w/ RW and close supervision. Pt self-propelled w/c to therapy gym w/ BUEs and increased time but no rest breaks needed. Worked on RLE strengthening/ROM in seated. See list below. Ongoing education on importance of initiating and maintaining HEP program for RLE strength and flexibility. Worked on Investment banker, operational w/ RW and ambulated 10' in total, 5' forward and 5' backward. Pt performed w/ CGA-close supervision and keep weight of RLE much better than previous attempt w/ this therapist. Returned to room total assist and ended session in supine. Ice applied to R knee for swelling management and stiffness relief.   RLE strengthening/ROM: -knee marches 2x10 -SAQ 2x5, active assist -isometric knee flexion, 2x10 -quad set, 2x5 -passive hamstring stretch 1 min x3 -passive knee flexion stretch 30 sec x3  Therapy Documentation Precautions:  Precautions Precautions: Fall Precaution Comments: helmet Restrictions Weight Bearing Restrictions: Yes RUE Weight Bearing: Weight bearing as tolerated LUE Weight Bearing: Weight bearing as tolerated RLE Weight Bearing: Non weight bearing Pain: Pain Assessment Pain Scale: 0-10 Pain Score: 7  Pain Type: Surgical pain Pain Location: Knee Pain Orientation: Right Pain  Descriptors / Indicators: Aching;Sore Pain Frequency: Constant Pain Onset: On-going Pain Intervention(s): Medication (See eMAR)  Therapy/Group: Individual Therapy  Penina Reisner Melton Krebs 02/13/2019, 9:33 AM

## 2019-02-13 NOTE — Progress Notes (Signed)
Orthopedic Tech Progress Note Patient Details:  Susan Wilkinson [birth year] [id number] I put patient on CPM and added ice to knee. Brought up the degree to 70. Patient was doing great! Patient ID: Susan Wilkinson, female   DOB: Jun 29, 1995, 24 y.o.   MRN: [id number]   Susan Wilkinson 02/13/2019, 7:47 PM
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
## 2019-02-13 NOTE — Progress Notes (Signed)
Pt returned from therapy, mother still had not arrived. Pt requested I call her. Called mother she informed that away.

## 2019-02-13 NOTE — Progress Notes (Signed)
Mother arrived. Wanted to know about discharge medications and if would be going home with Suboxone.  Informed her that will be provided all discharge information including medications at the time of discharge. Informed her that will need to come in the morning for education to be provided at the scheduled times.

## 2019-02-13 NOTE — Progress Notes (Signed)
Speech Language Pathology Daily Session Note  Patient Details  Name: Susan Wilkinson MRN: [id number] Date of Birth: Jan 08, 1996  Today's Date: 02/13/2019 SLP Individual Time: 0730-0825 SLP Individual Time Calculation (min): 55 min  Short Term Goals: Week 1: SLP Short Term Goal 1 (Week 1): STGS=LTGs due to ELOS  Skilled Therapeutic Interventions: Skilled treatment session focused on cognitive goals. Patient independently recalled the functions of her current medications and organized a QD weekly pill box. Patient also recalled procedures to a previously learned novel task with overall supervision level verbal cues with overall Mod I for problem solving. Patient left upright in bed with alarm on and all needs within reach. Continue with current plan of care.      Pain No/Denies Pain  Therapy/Group: Individual Therapy  Azari Hasler 02/13/2019, 12:49 PM

## 2019-02-13 NOTE — Progress Notes (Signed)
Pt mother to arrive in about an hour for education. Educated pt on Lovenox use, how to administer, importance of medication, and proper disposal.

## 2019-02-13 NOTE — Progress Notes (Signed)
Pt completed CPM for 4 hours last night.   Pt slept mostly through the night. Pt had one incontinent episode of urine while sleeping. No signs of distress at this time, call light in reach

## 2019-02-13 NOTE — Progress Notes (Signed)
Physical Therapy Session Note  Patient Details  Name: Susan Wilkinson MRN: [id number] Date of Birth: Jun 11, 1995  Today's Date: 02/13/2019 PT Individual Time: 1300-1330 PT Individual Time Calculation (min): 30 min   Short Term Goals: Week 1:  PT Short Term Goal 1 (Week 1): =LTGs due to ELOS Week 2:    Week 3:     Skilled Therapeutic Interventions/Progress Updates:    PAIN  RLE 5/10 w/therex/certain mobility.  Rest breaks given as needed and treatment to pt tolerance. Pt initially supine in bed.  Supine to sit using rail w/ supervision.  Squat pvt NWB RLE w/setup and vcs only. wc propulsion 144ft including turn w/supervision.  STS from wc w/cga to RW maintaining NWBing on RLE without difficulty.  Gait67ft w/RW w/cga, maintains NWB well.  Turn/sit to mat w/cga only.  Sit to supine w/cga.  Pt performed the following: Quad sets w/poor quality of contraction x 8 TKE's using blue bolster - unable to perform AROM, performed AAROM x 10 Supine AAROM hip abd/add x 8 Heel slides AAROM x 8 Supine to sit w/cga.  Mat to wc w/set up and cga. Gait 85ft mat to wc w/cga.  wc propulsion gym to room mod I using bilat UE's for strengthening/endurance.  Pt left w/nurse in room for nursing care.  Maintained NWB status well today through out with all transfers/gait, etc.  Improved gait performance/endurance.  Did not c/o rib pain in this session.   Therapy Documentation Precautions:  Precautions Precautions: Fall Precaution Comments: helmet Restrictions Weight Bearing Restrictions: Yes RUE Weight Bearing: Weight bearing as tolerated LUE Weight Bearing: Weight bearing as tolerated RLE Weight Bearing: Non weight bearing    Therapy/Group: Individual Therapy  Rada Hay, PT   Shearon Balo 02/13/2019, 3:47 PM

## 2019-02-14 ENCOUNTER — Encounter (HOSPITAL_COMMUNITY): Payer: Medicaid Other | Admitting: Speech Pathology

## 2019-02-14 ENCOUNTER — Inpatient Hospital Stay (HOSPITAL_COMMUNITY): Payer: No Typology Code available for payment source

## 2019-02-14 ENCOUNTER — Ambulatory Visit (HOSPITAL_COMMUNITY): Payer: Medicaid Other | Admitting: Physical Therapy

## 2019-02-14 ENCOUNTER — Encounter (HOSPITAL_COMMUNITY): Payer: Medicaid Other
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
## 2019-02-14 NOTE — Progress Notes (Signed)
Pt discharged to home with mom. Medications were delivered to patient from pharmacy. Discharge instructions were given to patient by PA Delle Reining.
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
## 2019-02-14 NOTE — Progress Notes (Signed)
Speech Language Pathology Discharge Summary  Patient Details  Name: Susan Wilkinson MRN: [id number] Date of Birth: Feb 09, 1995  Today's Date: 02/14/2019 SLP Individual Time: 79-1405 SLP Individual Time Calculation (min): 20 min   Skilled Therapeutic Interventions:  Skilled treatment session focused on completion of patient and family education with the patient and her mother. Both were educated in regards to patient's current cognitive functioning and strategies to utilize at home to maximize recall of functional information, safety and overall functional independence. Both verbalized understanding and handouts were also given to reinforce information. Patient requested to use the bathroom and was continent of urine with overall supervision for transfer to commode. Patient handed off to PT.   Patient has met 4 of 4 long term goals.  Patient to discharge at overall Min;Supervision level.   Reasons goals not met: N/A   Clinical Impression/Discharge Summary: Patient has made excellent gains and has met 3 of 3 LTGs this admission. Currently, patient demonstrates behaviors consistent with a Rancho Level VIII and requires overall supervision-Min A verbal cues to complete functional and familiar tasks safely in regards to recall, problem solving and awareness. Patient and family education is complete and patient will discharge home with 24 hour supervision from family. Patient would benefit from f/u SLP services to maximize her cognitive functioning and overall functional independence in order to reduce caregiver burden.   Care Partner:  Caregiver Able to Provide Assistance: Yes  Type of Caregiver Assistance: Physical;Cognitive  Recommendation:  Home Health SLP;24 hour supervision/assistance  Rationale for SLP Follow Up: Maximize cognitive function and independence;Reduce caregiver burden   Equipment: N/A   Reasons for discharge: Treatment goals met;Discharged from hospital   Patient/Family  Agrees with Progress Made and Goals Achieved: Yes    [place], [place] 02/14/2019, 6:46 AM

## 2019-02-14 NOTE — Progress Notes (Signed)
Physical Therapy Discharge Summary  Patient Details  Name: Susan Wilkinson MRN: [id number] Date of Birth: [birth year]  Today's Date: 02/14/2019 PT Individual Time: 1405-1430 PT Individual Time Calculation (min): 25 min   Pt received in w/c and agreeable to therapy, denies pain. Educated pt and mom on RLE strengthening/ROM HEP including quad sets, SAQs, abduction slides, and passive R hamstring stretching. Pt is able to perform all w/o assist from therapist and safely as demonstrated in previous therapy sessions. Discussed progression to heel slides, SLR, LAQs, and standing hip abduction once she can perform these tasks unaided. Provided w/ written handout of all exercises and progression exercises, instructed to start at 3 sets of 5 and build up to 3 sets of 10. Cautioned to perform all standing activity (including HEP) when her mom is right next to her for safety upon d/c until she re-learns her home environment. Both verbalized understanding and in agreement. Educated pt's mom on CLOF and pt ambulated 43' w/ RW and supervision. Pt keeps RLE off floor well and her mom provided appropriate guarding and is very attentive to [employer] safety. Reinforced importance of helmet use as well. Pt practiced car transfer at sedan height and performed w/ supervision. Returned to room and ended session in w/c, in care of mom and all needs met.   Patient has met 9 of 9 long term goals due to improved activity tolerance, improved balance, increased strength, increased range of motion, decreased pain and ability to compensate for deficits.  Patient to discharge at a wheelchair level Supervision for energy conservation, supervision for short distance gait w/ RW in the home. Patient's care partner is independent to provide the necessary physical assistance at discharge.  Reasons goals not met: n/a  Recommendation:  Patient will benefit from ongoing skilled PT services in home health setting to continue to advance safe  functional mobility, address ongoing impairments in functional strength, balance, RLE strength and ROM, and endurance, and minimize fall risk. Provided pt w/ HEP as detailed above including progression for when R knee strength and flexibility improves in open chain. Urged pt to make appointment at [place] [facility] in her home town to progress gait once she is cleared to put weight on RLE. Discussed importance of progressing her gait under supervision and guidance of a PT, both pt and mom verbalized understanding and in agreement.   Equipment: RW, pt already has w/c  Reasons for discharge: treatment goals met and discharge from hospital  Patient/family agrees with progress made and goals achieved: Yes  PT Discharge Precautions/Restrictions Precautions Precautions: Fall Precaution Comments: helmet Restrictions Weight Bearing Restrictions: Yes RUE Weight Bearing: Weight bearing as tolerated LUE Weight Bearing: Weight bearing as tolerated RLE Weight Bearing: Non weight bearing Vital Signs Therapy Vitals Temp: 98.4 F (36.9 C) Pulse Rate: 100 Resp: 16 BP: 116/81 Patient Position (if appropriate): Lying Oxygen Therapy SpO2: 100 % Pain Pain Assessment Pain Scale: 0-10 Pain Score: 6  Pain Type: Acute pain Pain Location: Knee Pain Orientation: Right Pain Intervention(s): Medication (See eMAR) Cognition Overall Cognitive Status: Impaired/Different from baseline Arousal/Alertness: Awake/alert Orientation Level: Oriented X4 Sustained Attention: Appears intact Selective Attention: Appears intact Memory: Impaired Memory Impairment: Decreased short term memory Decreased Short Term Memory: Functional complex Awareness: Impaired Awareness Impairment: Anticipatory impairment Problem Solving: Impaired Problem Solving Impairment: Functional complex Safety/Judgment: Appears intact [place] [employer] Scales of Cognitive Functioning: Purposeful/appropriate Sensation Sensation Light  Touch: Appears Intact Motor  Motor Motor: Within Functional Limits Motor - Discharge Observations: generalized weakness  Mobility  Bed Mobility Bed Mobility: Rolling Right;Rolling Left;Sit to Supine;Supine to Sit Rolling Right: Independent Rolling Left: Independent Supine to Sit: Independent Sit to Supine: Independent Transfers Transfers: Sit to Stand;Stand Pivot Transfers;Stand to Sit Sit to Stand: Supervision/Verbal cueing Stand to Sit: Supervision/Verbal cueing Stand Pivot Transfers: Supervision/Verbal cueing Transfer (Assistive device): None Locomotion  Gait Ambulation: Yes Gait Assistance: Supervision/Verbal cueing Gait Distance (Feet): 15 Feet Assistive device: Rolling walker Gait Gait velocity: decreased Stairs / Additional Locomotion Stairs: No Wheelchair Mobility Wheelchair Mobility: Yes Wheelchair Assistance: Chartered loss adjuster: Both upper extremities Wheelchair Parts Management: Supervision/cueing Distance: 150'  Trunk/Postural Assessment  Cervical Assessment Cervical Assessment: Within Functional Limits Thoracic Assessment Thoracic Assessment: Within Functional Limits Lumbar Assessment Lumbar Assessment: Within Functional Limits Postural Control Postural Control: Within Functional Limits  Balance Balance Balance Assessed: Yes Static Sitting Balance Static Sitting - Level of Assistance: 6: Modified independent (Device/Increase time) Dynamic Sitting Balance Dynamic Sitting - Level of Assistance: 6: Modified independent (Device/Increase time) Static Standing Balance Static Standing - Level of Assistance: 5: Stand by assistance Dynamic Standing Balance Dynamic Standing - Level of Assistance: 5: Stand by assistance Extremity Assessment  RLE Assessment RLE Assessment: Exceptions to [facility] Passive Range of Motion (PROM) Comments: knee ROM limited 5 deg-80 deg of flexion, pain/stiffness at end ranges, otherwise WNL throughout  extremity General Strength Comments: Minimally able to move extremity against gravity, 2/5 knee and hip, 3/5 ankle LLE Assessment LLE Assessment: Within Functional Limits    Makiah Foye K Jastin Fore 02/14/2019, 7:46 AM

## 2019-02-14 NOTE — Progress Notes (Signed)
Patient's father Juel Ripley and stepmother Kelin Nixon very concerned about patient discharge plan. They are requesting phone call from Attending MD, case management, and physical therapy. Provided phone number of case management to Asher Muir and will inform oncoming day nurse.  Pt slept on and off through the night. Pt completed 4 hours of CPM. Pt tolerated 70 degrees of CPM for 2 hours then requested decrease to 65 degrees.

## 2019-02-14 NOTE — Discharge Instructions (Signed)
Inpatient Rehab Discharge Instructions  Susan Wilkinson Discharge date and time: 02/14/19   Activities/Precautions/ Functional Status: Activity: no lifting, driving, or strenuous exercise  till cleared by MD Diet: regular diet Add extra salt to your food for a couple of weeks Try to drink Gatorade, milk or juices.  Wound Care: Wash incisions with soap and water. Keep wound clean and dry. Contact Dr. Jena Gauss if you develop any problems with your incision/wound on arms/legs--redness, swelling, increase in pain, drainage or if you develop fever or chills.   Functional status:  ___ No restrictions     ___ Walk up steps independently _X__ 24/7 supervision/assistance   ___ Walk up steps with assistance ___ Intermittent supervision/assistance  ___ Bathe/dress independently ___ Walk with walker     _X__ Bathe/dress with assistance ___ Walk Independently    ___ Shower independently ___ Walk with assistance    ___ Shower with assistance _X__ No alcohol/cocaine/marijuana/heroin/fentanyl or any other drugs.    Special Instructions: 1. Use ice to knee.shoulder 3-4 times a day for pain. You can use tylenol as needed additionally for pain.  2. No weight on Right leg till cleared by Dr. Jena Gauss. Call on Monday to schedule appointment. 3.Wear helmet when out of bed to protect your head--contact Dr. Maisie Fus for any problems with scalp wound.  Dr. Maisie Fus will set follow up Xrays/surgery in the future on follow up visits.   4. Per [facility] statutes, patients with seizures are not allowed to drive until  they have been seizure-free for six months. Use caution when using heavy equipment or power tools. Avoid working on ladders or at heights. NO baths, swimming or hot tubs. Avoid strobe lights.  When caring for infants or small children, sit down when holding, feeding, or changing them to minimize risk of injury to the child in the event you have a seizure.  Need to get good sleep--at least 8 hours per day. NO  alcohol or illicit drugs.  5.You/your family  Needs to check into the substance abuse resources given by Child psychotherapist.    My questions have been answered and I understand these instructions. I will adhere to these goals and the provided educational materials after my discharge from the hospital.  Patient/Caregiver Signature _______________________________ Date __________  Clinician Signature _______________________________________ Date __________  Please bring this form and your medication list with you to all your follow-up doctor's appointments.

## 2019-02-14 NOTE — Progress Notes (Signed)
Susan Wilkinson PHYSICAL MEDICINE & REHABILITATION PROGRESS NOTE   Subjective/Complaints: No new complaints. Happy to be going home. Denies new  Pain. Mom in yesterday and again today for family ed  ROS: Patient denies fever, rash, sore throat, blurred vision, nausea, vomiting, diarrhea, cough, shortness of breath or chest pain, joint or back pain, headache, or mood change.    Objective:   No results found. No results for input(s): WBC, HGB, HCT, PLT in the last 72 hours. Recent Labs    02/13/19 0527  NA 138  K 4.2  CL 103  CO2 23  GLUCOSE 95  BUN 11  CREATININE 0.40*  CALCIUM 9.2    Intake/Output Summary (Last 24 hours) at 02/14/2019 1034 Last data filed at 02/14/2019 2094 Gross per 24 hour  Intake 590 ml  Output --  Net 590 ml     Physical Exam: Vital Signs Blood pressure 116/81, pulse 100, temperature 98.4 F (36.9 C), resp. rate 16, weight 49.9 kg, last menstrual period 01/24/2019, SpO2 100 %. Constitutional: No distress . Vital signs reviewed. HEENT: EOMI, oral membranes moist Neck: supple Cardiovascular: RRR without murmur. No JVD    Respiratory: CTA Bilaterally without wheezes or rales. Normal effort    GI: BS +, non-tender, non-distended  Musculoskeletal:  Comments: RLE tender to palpation  Neurological: She is alertand oriented to person, place, and time.very good insight and awareness. Functional STM Clear speech. No focal CN findings. Reasonable insight and awareness.  Skin: road rash incisions all dry. A few sutures in distal thigh as well as shoulder Psych: flat but cooperative and pleasant   Assessment/Plan: 1. Functional deficits secondary to TBI with polytrauma which require 3+ hours per day of interdisciplinary therapy in a comprehensive inpatient rehab setting.  Physiatrist is providing close team supervision and 24 hour management of active medical problems listed below.  Physiatrist and rehab team continue to assess barriers to  discharge/monitor patient progress toward functional and medical goals  Care Tool:  Bathing    Body parts bathed by patient: Right arm, Left arm, Chest, Abdomen, Front perineal area, Buttocks, Right upper leg, Left upper leg, Left lower leg, Face, Right lower leg     Body parts n/a: Right lower leg(wrapped in gauze)   Bathing assist Assist Level: Supervision/Verbal cueing     Upper Body Dressing/Undressing Upper body dressing   What is the patient wearing?: Pull over shirt    Upper body assist Assist Level: Set up assist    Lower Body Dressing/Undressing Lower body dressing      What is the patient wearing?: Underwear/pull up, Pants     Lower body assist Assist for lower body dressing: Supervision/Verbal cueing     Toileting Toileting    Toileting assist Assist for toileting: Supervision/Verbal cueing     Transfers Chair/bed transfer  Transfers assist     Chair/bed transfer assist level: Set up assist     Locomotion Ambulation   Ambulation assist   Ambulation activity did not occur: Safety/medical concerns  Assist level: Contact Guard/Touching assist Assistive device: Walker-rolling Max distance: 10   Walk 10 feet activity   Assist  Walk 10 feet activity did not occur: Safety/medical concerns  Assist level: Contact Guard/Touching assist Assistive device: Walker-rolling   Walk 50 feet activity   Assist Walk 50 feet with 2 turns activity did not occur: Safety/medical concerns         Walk 150 feet activity   Assist Walk 150 feet activity did not occur: Safety/medical concerns  Walk 10 feet on uneven surface  activity   Assist Walk 10 feet on uneven surfaces activity did not occur: Safety/medical concerns         Wheelchair     Assist Will patient use wheelchair at discharge?: Yes Type of Wheelchair: Manual    Wheelchair assist level: Supervision/Verbal cueing Max wheelchair distance: 150'    Wheelchair 50  feet with 2 turns activity    Assist    Wheelchair 50 feet with 2 turns activity did not occur: (fatigue)   Assist Level: Supervision/Verbal cueing   Wheelchair 150 feet activity     Assist      Assist Level: Supervision/Verbal cueing   Blood pressure 116/81, pulse 100, temperature 98.4 F (36.9 C), resp. rate 16, weight 49.9 kg, last menstrual period 01/24/2019, SpO2 100 %.  Medical Problem List and Plan: 1. Impaired mobility and ADLs  secondary to TBI with SDH -patient may shower -DC 1/23, family ed today  --Continue CIR therapies including PT, OT, and SLP  -discussed outpt drug program. Mom goes to suboxone clinic locally in [place] which sounds like a good option for her. I asked the patient if she needs a referral.   -father apparently called last night with concerns over dispo. I called father twice this morning. He did not answer and his mail box was full, so message could be left.    -Patient to see me or Dr. Carlis Abbott in the office for transitional care encounter in 1-2 weeks.   -needs ortho and NS follow up as well.  2. Antithrombotics: -DVT/anticoagulation:Pharmaceutical:Lovenoxbid.      -dopplers negative, lovenox for about 4 more weeks -antiplatelet therapy: N/A 3. Pain Management:History of heroin, cocaine, THC use.   -oxycodone 5-10 mg q4 prn---can send home with limited supply  -oxycontin 10mg  q12 scheduled--reduce to daily, stop after last dose today  - robaxin 500mg  q6 prn 4. Mood:LCSW to follow for evaluation and support. -antipsychotic agents: N/A 5. Neuropsych: This patientis not fullycapable of making decisions on herown behalf. 6. Skin/Wound Care:Monitor incisions for healing. Vitamins and Protein supplements to promote healing.  -all sutures out 7. Fluids/Electrolytes/Nutrition:encourage PO  -  protein supp for low albumin to help promote wound healing 8. New onset seizures: Has  been seizure free on Keppra 750 mg bid.  9. Hyponatremia: sodium up to 138 1/21  -dc'ed FR 10. ORIF Right distal femoral shaft/supracodylar Fx/right quad tendon repair: NWB RLE.  -CPM until discharge--rom a home 11. ORIF left proximal humerus Fx: WBAT. 12. ABLA: Improving. HGB steady at 10.5 1/19 13. Thrombocytosis: Platelets stable at 966--watch for now 14. Leucocytosis: no active signs of infection. Wbc 12.8   15.Vitamin D deficiency: Continue supplements twice daily 16. Left craniectomy s/p SDH evacuation: Staples out  -continue Helmet for protection.    LOS: 4 days A FACE TO FACE EVALUATION WAS PERFORMED  2/21 02/14/2019, 10:34 AM

## 2019-02-15 DIAGNOSIS — E871 Hypo-osmolality and hyponatremia: Secondary | ICD-10-CM

## 2019-02-15 DIAGNOSIS — D72829 Elevated white blood cell count, unspecified: Secondary | ICD-10-CM

## 2019-02-15 DIAGNOSIS — D62 Acute posthemorrhagic anemia: Secondary | ICD-10-CM

## 2019-02-15 NOTE — Progress Notes (Signed)
Social Work Discharge Note   The overall goal for the admission was met for:   Discharge location: Yes  Length of Stay: Yes  Discharge activity level: Yes  Home/community participation: Yes  Services provided included: MD, RD, PT, OT, SLP, RN, TR, Pharmacy, Neuropsych and SW  Financial Services: uninsured  Follow-up services arranged: Home Health: PT, OT, ST were all recommended, however, pt living outside of this service area and could not secure ANY HH agencies willing to provide charity care services.  No f/u able to be arranged and pt/ mother aware of this (also step mother), DME: walker and tub seat via [employer] (mother reports they already have access to w/c at home) and Patient/Family has no preference for HH/DME agencies  Comments (or additional information):  Patient/Family verbalized understanding of follow-up arrangements: Yes  Individual responsible for coordination of the follow-up plan: mother  Confirmed correct DME delivered: Lennart Pall 02/15/2019    Susan Wilkinson

## 2019-02-17 ENCOUNTER — Telehealth: Payer: Self-pay | Admitting: Registered Nurse

## 2019-02-17 NOTE — Telephone Encounter (Signed)
Placed a call to Ms. Si ( Mother ) of Ms. Mikeila Burgen, no answer/ Left Message to call office. Placed a call to Ms. Devonna Oboyle, phone not functioning.

## 2019-02-18 NOTE — Telephone Encounter (Signed)
Placed another call to Susan Wilkinson Mother of Susan. Susan Wilkinson, no answer left message to return the call. Placed a call to [name] ( Father), unable to leave message, voice mail full.

## 2019-02-26 ENCOUNTER — Encounter: Payer: Self-pay | Admitting: Physical Medicine and Rehabilitation

## 2019-02-26 ENCOUNTER — Encounter: Payer: Medicaid Other | Admitting: Physical Medicine & Rehabilitation

## 2019-03-04 ENCOUNTER — Encounter: Payer: Self-pay | Attending: Physical Medicine & Rehabilitation | Admitting: Physical Medicine and Rehabilitation

## 2019-03-04 DIAGNOSIS — G8918 Other acute postprocedural pain: Secondary | ICD-10-CM | POA: Insufficient documentation

## 2019-03-04 DIAGNOSIS — R300 Dysuria: Secondary | ICD-10-CM | POA: Insufficient documentation

## 2019-03-04 DIAGNOSIS — S7291XF Unspecified fracture of right femur, subsequent encounter for open fracture type IIIA, IIIB, or IIIC with routine healing: Secondary | ICD-10-CM | POA: Insufficient documentation

## 2019-03-04 DIAGNOSIS — S069X3S Unspecified intracranial injury with loss of consciousness of 1 hour to 5 hours 59 minutes, sequela: Secondary | ICD-10-CM | POA: Insufficient documentation

## 2019-03-04 DIAGNOSIS — F1910 Other psychoactive substance abuse, uncomplicated: Secondary | ICD-10-CM | POA: Insufficient documentation

## 2019-03-05 ENCOUNTER — Telehealth: Payer: Self-pay | Admitting: *Deleted

## 2019-03-05 NOTE — Telephone Encounter (Signed)
Patients mother called stating that she has been trying for a couple of days to get [place] to refill her daughters pain medication.  She says her daughter was unable to go to doctors appointments this week because of illness.  She thinks she needs to get her tested for Covid.  Sh says she cannot even do that because her daughter cannot even get out of bed because of the pain. She emphatically proclaimed that her daughter needs pain meds.  Side note: I see no previous attempts to call, just [place] reaching out for transitional care with no success. 3 cancelled appointments, patient has not been seen in clinic yet. Mothers phone number is [phone]

## 2019-03-06 NOTE — Telephone Encounter (Signed)
This patient AND mother have significant histories of PSA. Mehar was advised to follow up with drug rehab/suboxone clinic near their home for further medications. (pt told me the mom went there) This phone call and the excuses are very suspicious.

## 2019-03-07 NOTE — Telephone Encounter (Signed)
Contacted patients mother I left a brief message stating that Dr. [name] Kill is not oing to prescribe pain medications.  Encouraged to call back for more details

## 2019-03-20 ENCOUNTER — Encounter (HOSPITAL_BASED_OUTPATIENT_CLINIC_OR_DEPARTMENT_OTHER): Payer: Self-pay | Admitting: Physical Medicine and Rehabilitation

## 2019-03-20 ENCOUNTER — Encounter: Payer: Self-pay | Admitting: Physical Medicine and Rehabilitation

## 2019-03-20 ENCOUNTER — Other Ambulatory Visit: Payer: Self-pay

## 2019-03-20 VITALS — BP 109/73 | HR 94 | Temp 97.7°F | Ht 62.0 in | Wt 108.0 lb

## 2019-03-20 DIAGNOSIS — G8918 Other acute postprocedural pain: Secondary | ICD-10-CM

## 2019-03-20 DIAGNOSIS — S069X3S Unspecified intracranial injury with loss of consciousness of 1 hour to 5 hours 59 minutes, sequela: Secondary | ICD-10-CM

## 2019-03-20 DIAGNOSIS — S7291XF Unspecified fracture of right femur, subsequent encounter for open fracture type IIIA, IIIB, or IIIC with routine healing: Secondary | ICD-10-CM

## 2019-03-20 DIAGNOSIS — R300 Dysuria: Secondary | ICD-10-CM

## 2019-03-20 DIAGNOSIS — F1910 Other psychoactive substance abuse, uncomplicated: Secondary | ICD-10-CM

## 2019-03-20 MED ORDER — VITAMIN D3 25 MCG PO TABS: 2000.0000 [IU] | ORAL_TABLET | Freq: Two times a day (BID) | ORAL | 0 refills | Status: AC

## 2019-03-20 MED ORDER — ACETAMINOPHEN 325 MG PO TABS: 325.0000 mg | ORAL_TABLET | ORAL | 0 refills | Status: AC | PRN

## 2019-03-20 MED ORDER — ASCORBIC ACID 250 MG PO TABS: 250.0000 mg | ORAL_TABLET | Freq: Two times a day (BID) | ORAL | 0 refills | Status: AC

## 2019-03-20 MED ORDER — LEVETIRACETAM 750 MG PO TABS: 750.0000 mg | ORAL_TABLET | Freq: Two times a day (BID) | ORAL | 1 refills | Status: AC

## 2019-03-20 MED ORDER — ADULT MULTIVITAMIN W/MINERALS CH: 1.0000 | ORAL_TABLET | Freq: Every day | ORAL | 0 refills | Status: AC

## 2019-03-20 MED ORDER — METHOCARBAMOL 500 MG PO TABS: 500.0000 mg | ORAL_TABLET | Freq: Four times a day (QID) | ORAL | 0 refills | Status: AC | PRN

## 2019-03-20 NOTE — Progress Notes (Signed)
Subjective:    Patient ID: Susan Wilkinson, female    DOB: 15-Aug-1995, 24 y.o.   MRN: [id number]  HPI  History obtained from patient, her mother, and chart review.  Susan Wilkinson is a 24 year old woman who presents for transitional care follow-up after CIR admission for traumatic brain injury.   She is still having some right knee pain and does not receive much relief from Oxycodone. 0/10 pain right now, pain is positional. She has since run out of Oxycodone and used Heroin for pain relief. Her mother was not aware that she should schedule her an appointment with the [place] clinic.   She has been having some foul-smelling vaginal discharge with dysuria and had a week of kidney pain, groin pain, fever, and chills. She missed her last appointment here because of her fever. The fever has since resolved but the dysuria persists.   She has not yet established care with a PCP and requires refills of her Robaxin, which does provide pain relief, Keppra, Vitamin C, Tylenol, Vitamin D, and multivitamin. She has enough Trazodone which does help her to sleep better. She only uses as needed. She does have a PCP but is unable to see them now as she has not insurance. She is in the process of applying for this.   She has been moving her bowels regularly. She has been eating a good diet with enough protein. Her mother cooks her dinner and she has been drinking milk. Her wounds have been healing well and she orthopedic and neurosurgical follow-up scheduled.    Working on [employer] care. [facility]. SW home therapy. Biggest concern is with pain medicine. Took more than what supposed to be oxycoodone. Pain  Pain Inventory Average Pain 10 Pain Right Now 0 My pain is intermittent, sharp and aching  In the last 24 hours, has pain interfered with the following? General activity 5 Relation with others 5 Enjoyment of life 5 What TIME of day is your pain at its worst? morning Sleep (in general)  Good  Pain is worse with: walking, bending, inactivity, standing and some activites Pain improves with: rest, heat/ice, therapy/exercise, pacing activities and medication Relief from Meds: 0  Mobility ability to climb steps?  no do you drive?  no  Function not employed: date last employed . I need assistance with the following:  dressing, bathing, meal prep, household duties and shopping  Neuro/Psych tremor trouble walking depression anxiety  Prior Studies Any changes since last visit?  no  Physicians involved in your care Any changes since last visit?  no   No family history on file. Social History   Socioeconomic History  . Marital status: Single    Spouse name: Not on file  . Number of children: Not on file  . Years of education: Not on file  . Highest education level: Not on file  Occupational History  . Not on file  Tobacco Use  . Smoking status: Current Every Day Smoker  . Smokeless tobacco: Never Used  Substance and Sexual Activity  . Alcohol use: Not on file  . Drug use: Not on file  . Sexual activity: Not Currently  Other Topics Concern  . Not on file  Social History Narrative  . Not on file   Social Determinants of Health   Financial Resource Strain:   . Difficulty of Paying Living Expenses: Not on file  Food Insecurity:   . Worried About Charity fundraiser in the Last Year: Not  on file  . Ran Out of Food in the Last Year: Not on file  Transportation Needs:   . Lack of Transportation (Medical): Not on file  . Lack of Transportation (Non-Medical): Not on file  Physical Activity:   . Days of Exercise per Week: Not on file  . Minutes of Exercise per Session: Not on file  Stress:   . Feeling of Stress : Not on file  Social Connections:   . Frequency of Communication with Friends and Family: Not on file  . Frequency of Social Gatherings with Friends and Family: Not on file  . Attends Religious Services: Not on file  . Active Member of Clubs  or Organizations: Not on file  . Attends Banker Meetings: Not on file  . Marital Status: Not on file   Past Surgical History:  Procedure Laterality Date  . CRANIOTOMY Left 01/29/2019   Procedure: Left Hemi Craniectomy for Subdural Hematoma;  Surgeon: Bedelia Person, MD;  Location[facility] OR;  Service: Neurosurgery;  Laterality: Left;  . EXTERNAL FIXATION LEG Right 01/30/2019   Procedure: EXTERNAL FIXATION RIGHT FEMUR;  Surgeon: Roby Lofts, MD;  Location: [facility] OR;  Service: Orthopedics;  Laterality: Right;  . I & D EXTREMITY Right 01/30/2019   Procedure: IRRIGATION AND DEBRIDEMENT EXTREMITY;  Surgeon: Roby Lofts, MD;  Location: [facility] OR;  Service: Orthopedics;  Laterality: Right;  . INCISION AND DRAINAGE OF WOUND Right 01/30/2019   Procedure: Irrigation And Debridement Wound;  Surgeon: Roby Lofts, MD;  Location: [facility] OR;  Service: Orthopedics;  Laterality: Right;  . NO PAST SURGERIES    . ORIF FEMUR FRACTURE Right 02/03/2019   Procedure: OPEN REDUCTION INTERNAL FIXATION (ORIF) DISTAL FEMUR FRACTURE;  Surgeon: Roby Lofts, MD;  Location: [facility] OR;  Service: Orthopedics;  Laterality: Right;  . ORIF HUMERUS FRACTURE Left 02/03/2019   Procedure: OPEN REDUCTION INTERNAL FIXATION (ORIF) PROXIMAL HUMERUS FRACTURE;  Surgeon: Roby Lofts, MD;  Location: [facility] OR;  Service: Orthopedics;  Laterality: Left;   Past Medical History:  Diagnosis Date  . Medical history non-contributory    There were no vitals taken for this visit.  Opioid Risk Score:   Fall Risk Score:  `1  Depression screen PHQ 2/9  No flowsheet data found.   Review of Systems  Constitutional: Negative.   HENT: Negative.   Eyes: Negative.   Respiratory: Negative.   Cardiovascular: Negative.   Gastrointestinal: Negative.   Endocrine: Negative.   Genitourinary: Negative.   Musculoskeletal: Positive for arthralgias and gait problem.  Skin: Negative.   Allergic/Immunologic: Negative.   Neurological: Positive  for tremors.  Hematological: Negative.   Psychiatric/Behavioral: Positive for dysphoric mood. The patient is nervous/anxious.   All other systems reviewed and are negative.      Objective:   Physical Exam Constitutional: No distress . Vital signs reviewed. HEENT: EOMI, oral membranes moist Neck: supple Cardiovascular: RRR without murmur. No JVD    Respiratory: CTA Bilaterally without wheezes or rales. Normal effort    GI: BS +, non-tender, non-distended  Musculoskeletal:  Comments: RLE tender to palpation. Continues to have swelling in right knee. Tenderness with mild palpation.   Neurological: She is alertand oriented to person, place, and time.very good insight and awareness. Functional STM Clear speech. No focal CN findings. Reasonable insight and awareness.  Skin: road rash incisions all dry. A few sutures in distal thigh as well as shoulder. Incision sites along right thigh and knee are healing well.  Psych: flat  but cooperative and pleasant GU: No tenderness to palpation along bilateral flanks.      Assessment & Plan:  1.Impaired mobility and ADLssecondary to TBI with SDH -Mrs. Mozingo has not received home PT and OT as patient is living outside of the [facility] service area and SW was unable to secure any [facility] agencies that were willing to provide charity services. She has been maintaining her mobility and performing her exercises as much as she can herself.              -discussed outpt drug program. Mom goes to suboxone clinic locally in [place] which sounds like a good option for her. Mom was unaware that she was to pursue this plan though was discussed in inpatient setting. She is agreeable to plan and will now help to schedule Ms. Halberstam for outpatient follow-up.               -follow-up ortho and NS follow has been scheduled.  2. Antithrombotics:  Patient still is using Lovenox on days that she is not very mobile, and not on days where she is mobile. She has  enough medication and should be weaned off in the next couple of weeks as she continues to be more mobile.  3. Pain Management:             - Provided refill of robaxin 500mg  q6 prn  - She has used heroin since discharge since she did not have oxycodone. Discussed follow-up at [facility] clinic as mentioned above.   4. Skin/Wound Care:Incisions are healing very well. Provided refills of multivitamins. Educated regarding importance of eating protein for wound healing.   5. New onset seizures: Has been seizure free on Keppra 750 mg bid. Provided refill of medication.   6. ORIF Right distal femoral shaft/supracodylar Fx/right quad tendon repair: NWB RLE.             -Continue ROM at home. She does have ortho follow-up scheduled. Healing very well. Swelling has much decreased as per patient.   7. ORIF left proximal humerus Fx: WBAT. Healing well.   8. Dysuria, flank pain, groin pain, fever and chills:  Ordered UA and UC for dysuria. I will call her with the results tomorrow.   9.Vitamin D deficiency: Continue supplements twice daily. Refill provided.   16. Left craniectomy s/p SDH evacuation:              -continue Helmet for protection. Has neurosurgery follow-up. Denies headache, memory issues.   RTC in 1 month if unable to establish PCP care.

## 2019-03-21 LAB — URINALYSIS, ROUTINE W REFLEX MICROSCOPIC
Bilirubin, UA: NEGATIVE
Glucose, UA: NEGATIVE
Ketones, UA: NEGATIVE
Nitrite, UA: NEGATIVE
RBC, UA: NEGATIVE
Specific Gravity, UA: 1.016 (ref 1.005–1.030)
Urobilinogen, Ur: 1 mg/dL (ref 0.2–1.0)
pH, UA: 6 (ref 5.0–7.5)

## 2019-03-21 LAB — MICROSCOPIC EXAMINATION
Casts: NONE SEEN /lpf
WBC, UA: >30 /hpf — AB (ref 0–5)

## 2019-03-22 LAB — URINE CULTURE

## 2019-03-25 ENCOUNTER — Ambulatory Visit: Payer: Self-pay | Admitting: Student

## 2019-03-25 DIAGNOSIS — S7291XC Unspecified fracture of right femur, initial encounter for open fracture type IIIA, IIIB, or IIIC: Secondary | ICD-10-CM

## 2019-03-27 ENCOUNTER — Telehealth: Payer: Self-pay | Admitting: *Deleted

## 2019-03-27 NOTE — Telephone Encounter (Signed)
Patient left a generic message asking if Dr. Carlis Abbott could call her about her test results (?).

## 2019-03-27 NOTE — Telephone Encounter (Signed)
I called patient previously and number was not in service. Called patient at this number and left voicemail.

## 2019-03-31 ENCOUNTER — Other Ambulatory Visit: Payer: Self-pay

## 2019-03-31 ENCOUNTER — Encounter (HOSPITAL_COMMUNITY): Payer: Self-pay | Admitting: Student

## 2019-03-31 ENCOUNTER — Other Ambulatory Visit (HOSPITAL_COMMUNITY): Payer: Self-pay | Admitting: Neurosurgery

## 2019-03-31 ENCOUNTER — Other Ambulatory Visit: Payer: Self-pay | Admitting: Neurosurgery

## 2019-03-31 ENCOUNTER — Other Ambulatory Visit
Admission: RE | Admit: 2019-03-31 | Discharge: 2019-03-31 | Disposition: A | Discharge status: Home / Self Care | Payer: Medicaid Other | Source: Ambulatory Visit | Attending: Student | Admitting: Student

## 2019-03-31 DIAGNOSIS — Z20822 Contact with and (suspected) exposure to covid-19: Secondary | ICD-10-CM | POA: Diagnosis not present

## 2019-03-31 DIAGNOSIS — Z01812 Encounter for preprocedural laboratory examination: Secondary | ICD-10-CM | POA: Diagnosis present

## 2019-03-31 DIAGNOSIS — M952 Other acquired deformity of head: Secondary | ICD-10-CM

## 2019-03-31 NOTE — Progress Notes (Signed)
Patient denies shortness of breath, fever, cough and chest pain.  PCP - Dr Quitman Livings  Cardiologist - denies  Chest x-ray - 02/04/19 EKG - 01/29/19 Stress Test - denies ECHO - denies Cardiac Cath - denies  Blood Thinner Instructions:  Follow your surgeon's instructions on when to stop Lovenox prior to surgery.  Patient agreed to call MD for these instructions.  ERAS: Clears 7 am DOS, no drink.  Anesthesia review: No  STOP now taking any Aspirin (unless otherwise instructed by your surgeon), Aleve, Naproxen, Ibuprofen, Motrin, Advil, Goody's, BC's, all herbal medications, fish oil, and all vitamins.   Coronavirus Screening Covid test on 03/31/19 at [facility] per patient. Have you experienced the following symptoms:  Cough yes/no: No Fever (>100.57F)  yes/no: No Runny nose yes/no: No Sore throat yes/no: No Difficulty breathing/shortness of breath  yes/no: No  Have you traveled in the last 14 days and where? yes/no: No  Patient verbalized understanding of instructions that were given via phone.

## 2019-04-01 LAB — SARS CORONAVIRUS 2 (TAT 6-24 HRS): SARS Coronavirus 2: NEGATIVE
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
## 2019-04-02 ENCOUNTER — Inpatient Hospital Stay (HOSPITAL_COMMUNITY): Payer: No Typology Code available for payment source

## 2019-04-02 ENCOUNTER — Other Ambulatory Visit: Payer: Self-pay

## 2019-04-02 ENCOUNTER — Inpatient Hospital Stay (HOSPITAL_COMMUNITY): Payer: No Typology Code available for payment source | Admitting: Certified Registered"

## 2019-04-02 ENCOUNTER — Encounter (HOSPITAL_COMMUNITY): Payer: Self-pay | Admitting: Student

## 2019-04-02 ENCOUNTER — Encounter (HOSPITAL_COMMUNITY)
Admission: RE | Disposition: A | Discharge status: Home / Self Care | Payer: Self-pay | Source: Home / Self Care | Attending: Student

## 2019-04-02 ENCOUNTER — Inpatient Hospital Stay (HOSPITAL_COMMUNITY)
Admission: RE | Admit: 2019-04-02 | Discharge: 2019-04-05 | DRG: 481 | Disposition: A | Discharge status: Home / Self Care | Payer: No Typology Code available for payment source | Attending: Student | Admitting: Student

## 2019-04-02 DIAGNOSIS — D62 Acute posthemorrhagic anemia: Secondary | ICD-10-CM | POA: Diagnosis not present

## 2019-04-02 DIAGNOSIS — F17210 Nicotine dependence, cigarettes, uncomplicated: Secondary | ICD-10-CM | POA: Diagnosis present

## 2019-04-02 DIAGNOSIS — S7290XA Unspecified fracture of unspecified femur, initial encounter for closed fracture: Secondary | ICD-10-CM

## 2019-04-02 DIAGNOSIS — Z79899 Other long term (current) drug therapy: Secondary | ICD-10-CM

## 2019-04-02 DIAGNOSIS — S72492C Other fracture of lower end of left femur, initial encounter for open fracture type IIIA, IIIB, or IIIC: Secondary | ICD-10-CM | POA: Diagnosis present

## 2019-04-02 DIAGNOSIS — Y92410 Unspecified street and highway as the place of occurrence of the external cause: Secondary | ICD-10-CM | POA: Diagnosis not present

## 2019-04-02 DIAGNOSIS — Z87820 Personal history of traumatic brain injury: Secondary | ICD-10-CM

## 2019-04-02 DIAGNOSIS — S7291XC Unspecified fracture of right femur, initial encounter for open fracture type IIIA, IIIB, or IIIC: Secondary | ICD-10-CM | POA: Insufficient documentation

## 2019-04-02 DIAGNOSIS — Z419 Encounter for procedure for purposes other than remedying health state, unspecified: Secondary | ICD-10-CM

## 2019-04-02 DIAGNOSIS — S72401K Unspecified fracture of lower end of right femur, subsequent encounter for closed fracture with nonunion: Secondary | ICD-10-CM | POA: Diagnosis present

## 2019-04-02 HISTORY — PX: ORIF FEMUR FRACTURE: SHX2119

## 2019-04-02 LAB — POCT PREGNANCY, URINE: Preg Test, Ur: NEGATIVE

## 2019-04-02 LAB — CBC
HCT: 31.8 % — ABNORMAL LOW (ref 36.0–46.0)
Hemoglobin: 9.4 g/dL — ABNORMAL LOW (ref 12.0–15.0)
MCH: 25.5 pg — ABNORMAL LOW (ref 26.0–34.0)
MCHC: 29.6 g/dL — ABNORMAL LOW (ref 30.0–36.0)
MCV: 86.4 fL (ref 80.0–100.0)
Platelets: 459 10*3/uL — ABNORMAL HIGH (ref 150–400)
RBC: 3.68 MIL/uL — ABNORMAL LOW (ref 3.87–5.11)
RDW: 17.8 % — ABNORMAL HIGH (ref 11.5–15.5)
WBC: 13.3 10*3/uL — ABNORMAL HIGH (ref 4.0–10.5)
nRBC: 0 % (ref 0.0–0.2)

## 2019-04-02 LAB — COMPREHENSIVE METABOLIC PANEL
ALT: 6 U/L (ref 0–44)
AST: 12 U/L — ABNORMAL LOW (ref 15–41)
Albumin: 2.6 g/dL — ABNORMAL LOW (ref 3.5–5.0)
Alkaline Phosphatase: 94 U/L (ref 38–126)
Anion gap: 12 (ref 5–15)
BUN: 8 mg/dL (ref 6–20)
CO2: 22 mmol/L (ref 22–32)
Calcium: 9.5 mg/dL (ref 8.9–10.3)
Chloride: 102 mmol/L (ref 98–111)
Creatinine, Ser: 0.58 mg/dL (ref 0.44–1.00)
GFR calc Af Amer: >60 mL/min (ref >60)
GFR calc non Af Amer: >60 mL/min (ref >60)
Glucose, Bld: 91 mg/dL (ref 70–99)
Potassium: 4.3 mmol/L (ref 3.5–5.1)
Sodium: 136 mmol/L (ref 135–145)
Total Bilirubin: 0.3 mg/dL (ref 0.3–1.2)
Total Protein: 7.8 g/dL (ref 6.5–8.1)

## 2019-04-02 SURGERY — OPEN REDUCTION INTERNAL FIXATION (ORIF) DISTAL FEMUR FRACTURE
Anesthesia: General | Laterality: Right

## 2019-04-02 MED ORDER — HYDROMORPHONE HCL 1 MG/ML IJ SOLN
INTRAMUSCULAR | Status: AC
  Filled 2019-04-02: qty 1

## 2019-04-02 MED ORDER — 0.9 % SODIUM CHLORIDE (POUR BTL) OPTIME
TOPICAL | Status: DC | PRN
  Administered 2019-04-02: 1000 mL

## 2019-04-02 MED ORDER — HYDROMORPHONE HCL 1 MG/ML IJ SOLN
INTRAMUSCULAR | Status: AC
  Filled 2019-04-02: qty 0.5

## 2019-04-02 MED ORDER — LEVETIRACETAM 750 MG PO TABS
750.0000 mg | ORAL_TABLET | Freq: Two times a day (BID) | ORAL | Status: DC
  Administered 2019-04-02 – 2019-04-05 (×6): 750 mg via ORAL
  Filled 2019-04-02 (×6): qty 1

## 2019-04-02 MED ORDER — LACTATED RINGERS IV SOLN: INTRAVENOUS | Status: DC

## 2019-04-02 MED ORDER — ONDANSETRON HCL 4 MG/2ML IJ SOLN
INTRAMUSCULAR | Status: AC
  Filled 2019-04-02: qty 2

## 2019-04-02 MED ORDER — MIDAZOLAM HCL 5 MG/5ML IJ SOLN
INTRAMUSCULAR | Status: DC | PRN
  Administered 2019-04-02: 2 mg via INTRAVENOUS

## 2019-04-02 MED ORDER — CEFAZOLIN SODIUM-DEXTROSE 2-4 GM/100ML-% IV SOLN
2.0000 g | Freq: Four times a day (QID) | INTRAVENOUS | Status: AC
  Administered 2019-04-02 – 2019-04-03 (×3): 2 g via INTRAVENOUS
  Filled 2019-04-02 (×3): qty 100

## 2019-04-02 MED ORDER — PHENYLEPHRINE 40 MCG/ML (10ML) SYRINGE FOR IV PUSH (FOR BLOOD PRESSURE SUPPORT)
PREFILLED_SYRINGE | INTRAVENOUS | Status: AC
  Filled 2019-04-02: qty 10

## 2019-04-02 MED ORDER — ROCURONIUM BROMIDE 10 MG/ML (PF) SYRINGE
PREFILLED_SYRINGE | INTRAVENOUS | Status: AC
  Filled 2019-04-02: qty 10

## 2019-04-02 MED ORDER — DEXAMETHASONE SODIUM PHOSPHATE 10 MG/ML IJ SOLN
INTRAMUSCULAR | Status: AC
  Filled 2019-04-02: qty 1

## 2019-04-02 MED ORDER — DOCUSATE SODIUM 100 MG PO CAPS
100.0000 mg | ORAL_CAPSULE | Freq: Two times a day (BID) | ORAL | Status: DC
  Administered 2019-04-02 – 2019-04-05 (×5): 100 mg via ORAL
  Filled 2019-04-02 (×6): qty 1

## 2019-04-02 MED ORDER — FENTANYL CITRATE (PF) 250 MCG/5ML IJ SOLN
INTRAMUSCULAR | Status: AC
  Filled 2019-04-02: qty 5

## 2019-04-02 MED ORDER — HYDROMORPHONE HCL 1 MG/ML IJ SOLN
1.0000 mg | INTRAMUSCULAR | Status: DC | PRN
  Administered 2019-04-02 – 2019-04-04 (×10): 1 mg via INTRAVENOUS
  Filled 2019-04-02 (×11): qty 1

## 2019-04-02 MED ORDER — OXYCODONE HCL 5 MG PO TABS
10.0000 mg | ORAL_TABLET | ORAL | Status: DC | PRN
  Administered 2019-04-03 – 2019-04-04 (×7): 15 mg via ORAL
  Filled 2019-04-02 (×7): qty 3

## 2019-04-02 MED ORDER — ALBUMIN HUMAN 5 % IV SOLN
12.5000 g | Freq: Once | INTRAVENOUS | Status: AC
  Administered 2019-04-02: 18:00:00 12.5 g via INTRAVENOUS

## 2019-04-02 MED ORDER — PROPOFOL 10 MG/ML IV BOLUS
INTRAVENOUS | Status: AC
  Filled 2019-04-02: qty 20

## 2019-04-02 MED ORDER — TRAZODONE HCL 50 MG PO TABS
50.0000 mg | ORAL_TABLET | Freq: Every evening | ORAL | Status: DC | PRN
  Administered 2019-04-02 – 2019-04-03 (×2): 50 mg via ORAL
  Filled 2019-04-02 (×2): qty 1

## 2019-04-02 MED ORDER — DEXMEDETOMIDINE HCL IN NACL 200 MCG/50ML IV SOLN
INTRAVENOUS | Status: DC | PRN
  Administered 2019-04-02: 12 ug via INTRAVENOUS
  Administered 2019-04-02 (×4): 8 ug via INTRAVENOUS

## 2019-04-02 MED ORDER — MIDAZOLAM HCL 2 MG/2ML IJ SOLN
INTRAMUSCULAR | Status: AC
  Filled 2019-04-02: qty 2

## 2019-04-02 MED ORDER — VANCOMYCIN HCL 1000 MG IV SOLR
INTRAVENOUS | Status: AC
  Filled 2019-04-02: qty 1000

## 2019-04-02 MED ORDER — CELECOXIB 200 MG PO CAPS
200.0000 mg | ORAL_CAPSULE | Freq: Once | ORAL | Status: AC
  Administered 2019-04-02: 12:00:00 200 mg via ORAL
  Filled 2019-04-02: qty 1

## 2019-04-02 MED ORDER — OXYCODONE HCL 5 MG PO TABS
5.0000 mg | ORAL_TABLET | ORAL | Status: DC | PRN
  Administered 2019-04-02: 10 mg via ORAL
  Filled 2019-04-02: qty 2

## 2019-04-02 MED ORDER — SUCCINYLCHOLINE CHLORIDE 200 MG/10ML IV SOSY
PREFILLED_SYRINGE | INTRAVENOUS | Status: DC | PRN
  Administered 2019-04-02: 80 mg via INTRAVENOUS

## 2019-04-02 MED ORDER — CEFAZOLIN SODIUM-DEXTROSE 2-4 GM/100ML-% IV SOLN
2.0000 g | INTRAVENOUS | Status: AC
  Administered 2019-04-02: 2 g via INTRAVENOUS

## 2019-04-02 MED ORDER — FENTANYL CITRATE (PF) 250 MCG/5ML IJ SOLN
INTRAMUSCULAR | Status: DC | PRN
  Administered 2019-04-02 (×3): 50 ug via INTRAVENOUS
  Administered 2019-04-02: 100 ug via INTRAVENOUS
  Administered 2019-04-02: 50 ug via INTRAVENOUS
  Administered 2019-04-02: 150 ug via INTRAVENOUS
  Administered 2019-04-02: 100 ug via INTRAVENOUS
  Administered 2019-04-02: 150 ug via INTRAVENOUS
  Administered 2019-04-02: 50 ug via INTRAVENOUS

## 2019-04-02 MED ORDER — ESMOLOL HCL 100 MG/10ML IV SOLN
INTRAVENOUS | Status: DC | PRN
  Administered 2019-04-02 (×10): 10 mg via INTRAVENOUS

## 2019-04-02 MED ORDER — KETAMINE HCL 10 MG/ML IJ SOLN
INTRAMUSCULAR | Status: DC | PRN
  Administered 2019-04-02 (×2): 10 mg via INTRAVENOUS
  Administered 2019-04-02: 30 mg via INTRAVENOUS

## 2019-04-02 MED ORDER — SODIUM CHLORIDE 0.9 % IV SOLN: INTRAVENOUS | Status: DC

## 2019-04-02 MED ORDER — METHOCARBAMOL 500 MG PO TABS
ORAL_TABLET | ORAL | Status: AC
  Filled 2019-04-02: qty 1

## 2019-04-02 MED ORDER — MIDAZOLAM HCL 2 MG/2ML IJ SOLN
INTRAMUSCULAR | Status: AC
  Administered 2019-04-02: 18:00:00 2 mg
  Filled 2019-04-02: qty 2

## 2019-04-02 MED ORDER — METHOCARBAMOL 500 MG PO TABS
500.0000 mg | ORAL_TABLET | Freq: Four times a day (QID) | ORAL | Status: DC | PRN
  Administered 2019-04-02 – 2019-04-05 (×8): 500 mg via ORAL
  Filled 2019-04-02 (×8): qty 1

## 2019-04-02 MED ORDER — LIDOCAINE 2% (20 MG/ML) 5 ML SYRINGE
INTRAMUSCULAR | Status: AC
  Filled 2019-04-02: qty 5

## 2019-04-02 MED ORDER — METOCLOPRAMIDE HCL 5 MG/ML IJ SOLN: 5.0000 mg | Freq: Three times a day (TID) | INTRAMUSCULAR | Status: DC | PRN

## 2019-04-02 MED ORDER — HYDROMORPHONE HCL 1 MG/ML IJ SOLN
INTRAMUSCULAR | Status: DC | PRN
  Administered 2019-04-02: .5 mg via INTRAVENOUS

## 2019-04-02 MED ORDER — ONDANSETRON HCL 4 MG/2ML IJ SOLN: 4.0000 mg | Freq: Four times a day (QID) | INTRAMUSCULAR | Status: DC | PRN

## 2019-04-02 MED ORDER — GABAPENTIN 100 MG PO CAPS
100.0000 mg | ORAL_CAPSULE | Freq: Three times a day (TID) | ORAL | Status: DC
  Administered 2019-04-02 – 2019-04-05 (×8): 100 mg via ORAL
  Filled 2019-04-02 (×8): qty 1

## 2019-04-02 MED ORDER — KETAMINE HCL 50 MG/5ML IJ SOSY
PREFILLED_SYRINGE | INTRAMUSCULAR | Status: AC
  Filled 2019-04-02: qty 5

## 2019-04-02 MED ORDER — SODIUM CHLORIDE 0.9 % IR SOLN
Status: DC | PRN
  Administered 2019-04-02: 3000 mL

## 2019-04-02 MED ORDER — DIPHENHYDRAMINE HCL 50 MG/ML IJ SOLN
INTRAMUSCULAR | Status: AC
  Filled 2019-04-02: qty 1

## 2019-04-02 MED ORDER — METOCLOPRAMIDE HCL 5 MG PO TABS: 5.0000 mg | ORAL_TABLET | Freq: Three times a day (TID) | ORAL | Status: DC | PRN

## 2019-04-02 MED ORDER — VITAMIN D 25 MCG (1000 UNIT) PO TABS
2000.0000 [IU] | ORAL_TABLET | Freq: Two times a day (BID) | ORAL | Status: DC
  Administered 2019-04-02 – 2019-04-05 (×6): 2000 [IU] via ORAL
  Filled 2019-04-02 (×7): qty 2

## 2019-04-02 MED ORDER — ACETAMINOPHEN 500 MG PO TABS
1000.0000 mg | ORAL_TABLET | Freq: Four times a day (QID) | ORAL | Status: DC
  Administered 2019-04-02 – 2019-04-04 (×8): 1000 mg via ORAL
  Administered 2019-04-04: 23:00:00 500 mg via ORAL
  Administered 2019-04-05 (×2): 1000 mg via ORAL
  Filled 2019-04-02 (×12): qty 2

## 2019-04-02 MED ORDER — ASCORBIC ACID 500 MG PO TABS
250.0000 mg | ORAL_TABLET | Freq: Two times a day (BID) | ORAL | Status: DC
  Administered 2019-04-02 – 2019-04-05 (×6): 250 mg via ORAL
  Filled 2019-04-02 (×6): qty 1

## 2019-04-02 MED ORDER — METHOCARBAMOL 1000 MG/10ML IJ SOLN
500.0000 mg | Freq: Four times a day (QID) | INTRAVENOUS | Status: DC | PRN
  Filled 2019-04-02: qty 5

## 2019-04-02 MED ORDER — LIDOCAINE 2% (20 MG/ML) 5 ML SYRINGE
INTRAMUSCULAR | Status: DC | PRN
  Administered 2019-04-02: 100 mg via INTRAVENOUS

## 2019-04-02 MED ORDER — DIPHENHYDRAMINE HCL 50 MG/ML IJ SOLN
INTRAMUSCULAR | Status: DC | PRN
  Administered 2019-04-02: 12.5 mg via INTRAVENOUS

## 2019-04-02 MED ORDER — PROMETHAZINE HCL 25 MG/ML IJ SOLN: 6.2500 mg | INTRAMUSCULAR | Status: DC | PRN

## 2019-04-02 MED ORDER — DEXAMETHASONE SODIUM PHOSPHATE 10 MG/ML IJ SOLN
INTRAMUSCULAR | Status: DC | PRN
  Administered 2019-04-02: 10 mg via INTRAVENOUS

## 2019-04-02 MED ORDER — ALBUMIN HUMAN 5 % IV SOLN: INTRAVENOUS | Status: DC | PRN

## 2019-04-02 MED ORDER — ALBUMIN HUMAN 5 % IV SOLN
INTRAVENOUS | Status: AC
  Filled 2019-04-02: qty 250

## 2019-04-02 MED ORDER — MIDAZOLAM HCL 2 MG/2ML IJ SOLN
2.0000 mg | Freq: Once | INTRAMUSCULAR | Status: AC
  Administered 2019-04-02: 2 mg via INTRAVENOUS

## 2019-04-02 MED ORDER — ACETAMINOPHEN 500 MG PO TABS
1000.0000 mg | ORAL_TABLET | Freq: Once | ORAL | Status: AC
  Administered 2019-04-02: 1000 mg via ORAL
  Filled 2019-04-02: qty 2

## 2019-04-02 MED ORDER — ESMOLOL HCL 100 MG/10ML IV SOLN
INTRAVENOUS | Status: AC
  Filled 2019-04-02: qty 10

## 2019-04-02 MED ORDER — PROPOFOL 10 MG/ML IV BOLUS
INTRAVENOUS | Status: DC | PRN
  Administered 2019-04-02: 130 mg via INTRAVENOUS

## 2019-04-02 MED ORDER — HYDROMORPHONE HCL 1 MG/ML IJ SOLN
0.2500 mg | INTRAMUSCULAR | Status: DC | PRN
  Administered 2019-04-02 (×3): 0.5 mg via INTRAVENOUS

## 2019-04-02 MED ORDER — ENOXAPARIN SODIUM 40 MG/0.4ML ~~LOC~~ SOLN: 40.0000 mg | SUBCUTANEOUS | Status: DC

## 2019-04-02 MED ORDER — CHLORHEXIDINE GLUCONATE 4 % EX LIQD: 60.0000 mL | Freq: Once | CUTANEOUS | Status: DC

## 2019-04-02 MED ORDER — ONDANSETRON HCL 4 MG PO TABS: 4.0000 mg | ORAL_TABLET | Freq: Four times a day (QID) | ORAL | Status: DC | PRN

## 2019-04-02 SURGICAL SUPPLY — 72 items
BIT DRILL QC 3.3X195 (BIT) ×1 IMPLANT
BLADE CLIPPER SURG (BLADE) IMPLANT
BNDG COHESIVE 6X5 TAN STRL LF (GAUZE/BANDAGES/DRESSINGS) ×2 IMPLANT
BNDG ELASTIC 6X10 VLCR STRL LF (GAUZE/BANDAGES/DRESSINGS) ×3 IMPLANT
BNDG ELASTIC 6X5.8 VLCR STR LF (GAUZE/BANDAGES/DRESSINGS) ×1 IMPLANT
BONE CANC CHIPS 40CC CAN1/2 (Bone Implant) ×2 IMPLANT
BRUSH SCRUB EZ PLAIN DRY (MISCELLANEOUS) ×4 IMPLANT
CANISTER SUCT 3000ML PPV (MISCELLANEOUS) ×1 IMPLANT
CHIPS CANC BONE 40CC CAN1/2 (Bone Implant) ×1 IMPLANT
CHLORAPREP W/TINT 26 (MISCELLANEOUS) ×3 IMPLANT
CLSR STERI-STRIP ANTIMIC 1/2X4 (GAUZE/BANDAGES/DRESSINGS) ×2 IMPLANT
COVER SURGICAL LIGHT HANDLE (MISCELLANEOUS) ×2 IMPLANT
COVER WAND RF STERILE (DRAPES) ×1 IMPLANT
DRAPE C-ARM 42X72 X-RAY (DRAPES) ×2 IMPLANT
DRAPE C-ARMOR (DRAPES) ×2 IMPLANT
DRAPE HALF SHEET 40X57 (DRAPES) ×4 IMPLANT
DRAPE ORTHO SPLIT 77X108 STRL (DRAPES) ×1
DRAPE SURG 17X23 STRL (DRAPES) ×1 IMPLANT
DRAPE SURG ORHT 6 SPLT 77X108 (DRAPES) ×2 IMPLANT
DRAPE U-SHAPE 47X51 STRL (DRAPES) ×3 IMPLANT
DRSG ADAPTIC 3X8 NADH LF (GAUZE/BANDAGES/DRESSINGS) IMPLANT
DRSG MEPILEX BORDER 4X12 (GAUZE/BANDAGES/DRESSINGS) IMPLANT
DRSG MEPILEX BORDER 4X4 (GAUZE/BANDAGES/DRESSINGS) IMPLANT
DRSG MEPILEX BORDER 4X8 (GAUZE/BANDAGES/DRESSINGS) IMPLANT
DRSG MEPITEL 4X7.2 (GAUZE/BANDAGES/DRESSINGS) ×2 IMPLANT
DRSG PAD ABDOMINAL 8X10 ST (GAUZE/BANDAGES/DRESSINGS) ×3 IMPLANT
ELECT REM PT RETURN 9FT ADLT (ELECTROSURGICAL) ×2
ELECTRODE REM PT RTRN 9FT ADLT (ELECTROSURGICAL) ×1 IMPLANT
GAUZE SPONGE 4X4 12PLY STRL (GAUZE/BANDAGES/DRESSINGS) ×4 IMPLANT
GLOVE BIO SURGEON STRL SZ 6.5 (GLOVE) ×6 IMPLANT
GLOVE BIO SURGEON STRL SZ7.5 (GLOVE) ×8 IMPLANT
GLOVE BIOGEL PI IND STRL 6.5 (GLOVE) ×1 IMPLANT
GLOVE BIOGEL PI IND STRL 7.5 (GLOVE) ×1 IMPLANT
GLOVE BIOGEL PI INDICATOR 6.5 (GLOVE) ×1
GLOVE BIOGEL PI INDICATOR 7.5 (GLOVE) ×1
GOWN STRL REUS W/ TWL LRG LVL3 (GOWN DISPOSABLE) ×3 IMPLANT
GOWN STRL REUS W/TWL LRG LVL3 (GOWN DISPOSABLE) ×3
GRAFT BNE CHIP CANC 1-8 40 (Bone Implant) IMPLANT
GUIDEWIRE 3.2X400 (WIRE) ×1 IMPLANT
K-WIRE 2.0 (WIRE) ×3
K-WIRE FXSTD 280X2XNS SS (WIRE) ×3
KIT BASIN OR (CUSTOM PROCEDURE TRAY) ×2 IMPLANT
KIT BONE HARVEST RIA 2 (ORTHOPEDIC DISPOSABLE SUPPLIES) ×1 IMPLANT
KIT INFUSE MEDIUM (Orthopedic Implant) ×1 IMPLANT
KIT TURNOVER KIT B (KITS) ×2 IMPLANT
KWIRE FXSTD 280X2XNS SS (WIRE) IMPLANT
NS IRRIG 1000ML POUR BTL (IV SOLUTION) ×2 IMPLANT
PACK TOTAL JOINT (CUSTOM PROCEDURE TRAY) ×2 IMPLANT
PAD ARMBOARD 7.5X6 YLW CONV (MISCELLANEOUS) ×4 IMPLANT
PAD CAST 4YDX4 CTTN HI CHSV (CAST SUPPLIES) ×1 IMPLANT
PADDING CAST COTTON 4X4 STRL (CAST SUPPLIES) ×1
PADDING CAST COTTON 6X4 STRL (CAST SUPPLIES) ×3 IMPLANT
PLATE STRT NCB PA 202 14H (Plate) ×1 IMPLANT
REAMER HEAD RIA 2 12.5 (INSTRUMENTS) ×1 IMPLANT
REAMER ROD DEEP FLUTE 2.5X950 (INSTRUMENTS) ×1 IMPLANT
SCREW NCB 4.0MX44M (Screw) ×1 IMPLANT
SPONGE LAP 18X18 RF (DISPOSABLE) IMPLANT
STAPLER VISISTAT 35W (STAPLE) ×1 IMPLANT
SUCTION FRAZIER HANDLE 10FR (MISCELLANEOUS) ×1
SUCTION TUBE FRAZIER 10FR DISP (MISCELLANEOUS) ×1 IMPLANT
SUT ETHILON 3 0 PS 1 (SUTURE) ×2 IMPLANT
SUT MNCRL AB 3-0 PS2 27 (SUTURE) ×1 IMPLANT
SUT VIC AB 0 CT1 27 (SUTURE)
SUT VIC AB 0 CT1 27XBRD ANBCTR (SUTURE) IMPLANT
SUT VIC AB 1 CT1 27 (SUTURE)
SUT VIC AB 1 CT1 27XBRD ANBCTR (SUTURE) IMPLANT
SUT VIC AB 2-0 CT1 27 (SUTURE)
SUT VIC AB 2-0 CT1 TAPERPNT 27 (SUTURE) ×2 IMPLANT
TOWEL GREEN STERILE (TOWEL DISPOSABLE) ×3 IMPLANT
TRAY FOLEY MTR SLVR 16FR STAT (SET/KITS/TRAYS/PACK) IMPLANT
WATER STERILE IRR 1000ML POUR (IV SOLUTION) ×2 IMPLANT
YANKAUER SUCT BULB TIP NO VENT (SUCTIONS) ×1 IMPLANT

## 2019-04-02 NOTE — Interval H&P Note (Signed)
History and Physical Interval Note:  04/02/2019 1:20 PM  Susan Wilkinson  has presented today for surgery, with the diagnosis of Right femur nonunion.  The various methods of treatment have been discussed with the patient and family. After consideration of risks, benefits and other options for treatment, the patient has consented to  Procedure(s): REPAIR RIGHT FEMORAL NONUNION WITH LEFT FEMUR RETROGRADE RIA (Right) as a surgical intervention.  The patient's history has been reviewed, patient examined, no change in status, stable for surgery.  I have reviewed the patient's chart and labs.  Questions were answered to the patient's satisfaction.     Caryn Bee P Allyn Bertoni

## 2019-04-02 NOTE — Anesthesia Postprocedure Evaluation (Signed)
Anesthesia Post Note  Patient: Susan Wilkinson  Procedure(s) Performed: REPAIR RIGHT FEMORAL NONUNION WITH LEFT FEMUR RETROGRADE RIA (Right )     Patient location during evaluation: PACU Anesthesia Type: General Level of consciousness: sedated Pain management: pain level controlled Vital Signs Assessment: post-procedure vital signs reviewed and stable Respiratory status: spontaneous breathing and respiratory function stable Cardiovascular status: stable Postop Assessment: no apparent nausea or vomiting Anesthetic complications: no    Last Vitals:  Vitals:   04/02/19 1823 04/02/19 2021  BP: 96/64 102/60  Pulse: 78 85  Resp: 15 18  Temp: 36.6 C 36.4 C  SpO2: 100% 100%    Last Pain:  Vitals:   04/02/19 2021  TempSrc: Oral  PainSc:                  [name] DANIEL

## 2019-04-02 NOTE — Anesthesia Preprocedure Evaluation (Addendum)
Anesthesia Evaluation  Patient identified by MRN, date of birth, ID band Patient awake    Reviewed: Allergy & Precautions, H&P , NPO status , Patient's Chart, lab work & pertinent test results  Airway Mallampati: II  TM Distance: >3 FB Neck ROM: Full    Dental no notable dental hx. (+) Teeth Intact, Dental Advisory Given   Pulmonary Current Smoker,    Pulmonary exam normal breath sounds clear to auscultation       Cardiovascular negative cardio ROS   Rhythm:Regular Rate:Normal     Neuro/Psych Seizures -, Well Controlled,  negative psych ROS   GI/Hepatic negative GI ROS, (+)     substance abuse  IV drug use,   Endo/Other  negative endocrine ROS  Renal/GU negative Renal ROS  negative genitourinary   Musculoskeletal   Abdominal   Peds  Hematology negative hematology ROS (+)   Anesthesia Other Findings   Reproductive/Obstetrics negative OB ROS                            Anesthesia Physical Anesthesia Plan  ASA: III  Anesthesia Plan: General   Post-op Pain Management:    Induction: Intravenous  PONV Risk Score and Plan: 3 and Ondansetron, Dexamethasone and Midazolam  Airway Management Planned: Oral ETT  Additional Equipment:   Intra-op Plan:   Post-operative Plan: Extubation in OR  Informed Consent: I have reviewed the patients History and Physical, chart, labs and discussed the procedure including the risks, benefits and alternatives for the proposed anesthesia with the patient or authorized representative who has indicated his/her understanding and acceptance.     Dental advisory given  Plan Discussed with: CRNA  Anesthesia Plan Comments:         Anesthesia Quick Evaluation

## 2019-04-02 NOTE — Anesthesia Procedure Notes (Signed)
Procedure Name: Intubation Date/Time: 04/02/2019 2:37 PM Performed by: Mayer Camel, CRNA Pre-anesthesia Checklist: Patient identified, Emergency Drugs available, Suction available and Patient being monitored Patient Re-evaluated:Patient Re-evaluated prior to induction Oxygen Delivery Method: Circle System Utilized Preoxygenation: Pre-oxygenation with 100% oxygen Induction Type: IV induction and Rapid sequence Laryngoscope Size: 2 Grade View: Grade I Tube type: Oral Tube size: 7.0 mm Number of attempts: 1 Airway Equipment and Method: Stylet Placement Confirmation: ETT inserted through vocal cords under direct vision,  positive ETCO2 and breath sounds checked- equal and bilateral Secured at: 20 cm Tube secured with: Tape Dental Injury: Teeth and Oropharynx as per pre-operative assessment

## 2019-04-02 NOTE — Transfer of Care (Signed)
Immediate Anesthesia Transfer of Care Note  Patient: Susan Wilkinson  Procedure(s) Performed: REPAIR RIGHT FEMORAL NONUNION WITH LEFT FEMUR RETROGRADE RIA (Right )  Patient Location: PACU  Anesthesia Type:General  Level of Consciousness: awake, alert  and oriented  Airway & Oxygen Therapy: Patient Spontanous Breathing and Patient connected to face mask oxygen  Post-op Assessment: Report given to RN and Post -op Vital signs reviewed and stable  Post vital signs: Reviewed and stable  Last Vitals:  Vitals Value Taken Time  BP 105/76 04/02/19 1723  Temp    Pulse 80 04/02/19 1727  Resp 15 04/02/19 1727  SpO2 100 % 04/02/19 1727  Vitals shown include unvalidated device data.  Last Pain:  Vitals:   04/02/19 1105  TempSrc:   PainSc: 7       Patients Stated Pain Goal: 5 (04/02/19 1105)  Complications: No apparent anesthesia complications
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
## 2019-04-02 NOTE — Progress Notes (Signed)
Orthopedic Tech Progress Note Patient Details:  Susan Wilkinson [birth year] [id number] Came to apply CPM as well as The Over head Frame but patient refused the CPM saying "she's in to much pain for that" I did ask the RN to get in touch with [facility] when patient feels up to using the CPM Patient ID: Susan Wilkinson, female   DOB: 02/25/1995, 24 y.o.   MRN: [id number]   Susan Wilkinson 04/02/2019, 7:00 PM

## 2019-04-02 NOTE — Progress Notes (Signed)
Ortho Trauma Update  Patient was 3 hours late when she was initially scheduled to arrive.  Currently admitted on upon arrival that she has been using IV drugs yesterday.  When I discussed with her she said that she was using them this morning about 4 AM.  She states that she has been using them for about 3 weeks.  I discussed with her of the severe nature of IV drug abuse in the setting of her bone defect.  She can get bacteremic and septic and can seed her bone graft.  This could be a complete disaster requiring above-knee amputation.  If we were to cancel the surgery today and discharge her she would likely continue to use IV drugs and would not likely get clean to proceed with the surgery within the near future.  The induced membrane technique that had been performed is a relatively time sensitive and cannot wait for a prolonged period time further than 1 or 2 months from now.  I feel that the current state of her leg with the antibiotic spacer and plate is likely to fail with repetitive cycles of weightbearing and then she would be in a worse situation with a deformity hardware failure and possible infection as well.  I feel that proceeding with the bone grafting surgery will mitigate as much risk as possible.  We will also allow her to stay in the hospital and monitor her withdrawal symptoms.  We will also discuss postoperatively about possibly starting Suboxone in the hospital.  I feel that she is at high risk of complications with or without proceeding with surgery today.  Roby Lofts, MD [facility] [phone] (office) [email or web address]

## 2019-04-03 ENCOUNTER — Encounter: Payer: Self-pay | Admitting: *Deleted

## 2019-04-03 LAB — BASIC METABOLIC PANEL
Anion gap: 11 (ref 5–15)
BUN: 6 mg/dL (ref 6–20)
CO2: 20 mmol/L — ABNORMAL LOW (ref 22–32)
Calcium: 9.1 mg/dL (ref 8.9–10.3)
Chloride: 104 mmol/L (ref 98–111)
Creatinine, Ser: 0.64 mg/dL (ref 0.44–1.00)
GFR calc Af Amer: >60 mL/min (ref >60)
GFR calc non Af Amer: >60 mL/min (ref >60)
Glucose, Bld: 194 mg/dL — ABNORMAL HIGH (ref 70–99)
Potassium: 4.2 mmol/L (ref 3.5–5.1)
Sodium: 135 mmol/L (ref 135–145)

## 2019-04-03 LAB — CBC
HCT: 24.3 % — ABNORMAL LOW (ref 36.0–46.0)
Hemoglobin: 7.6 g/dL — ABNORMAL LOW (ref 12.0–15.0)
MCH: 25.9 pg — ABNORMAL LOW (ref 26.0–34.0)
MCHC: 31.3 g/dL (ref 30.0–36.0)
MCV: 82.9 fL (ref 80.0–100.0)
Platelets: 454 10*3/uL — ABNORMAL HIGH (ref 150–400)
RBC: 2.93 MIL/uL — ABNORMAL LOW (ref 3.87–5.11)
RDW: 17.3 % — ABNORMAL HIGH (ref 11.5–15.5)
WBC: 14.8 10*3/uL — ABNORMAL HIGH (ref 4.0–10.5)
nRBC: 0 % (ref 0.0–0.2)

## 2019-04-03 MED ORDER — KETOROLAC TROMETHAMINE 15 MG/ML IJ SOLN
15.0000 mg | Freq: Four times a day (QID) | INTRAMUSCULAR | Status: AC
  Administered 2019-04-03 – 2019-04-04 (×5): 15 mg via INTRAVENOUS
  Filled 2019-04-03 (×5): qty 1

## 2019-04-03 NOTE — Progress Notes (Signed)
Orthopedic Tech Progress Note Patient Details:  Susan Wilkinson December 06, 1995 [id number]  CPM Right Knee CPM Right Knee: Off  Post Interventions Patient Tolerated: Refused intervention Instructions Provided: Care of device  Saul Fordyce 04/03/2019, 12:13 PM
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
## 2019-04-03 NOTE — Progress Notes (Signed)
Orthopedic Tech Progress Note Patient Details:  Susan Wilkinson 06/04/1995 [id number]  CPM Right Knee CPM Right Knee: Off Right Knee Flexion (Degrees): 35 Right Knee Extension (Degrees): 0  Post Interventions Patient Tolerated: Refused intervention Instructions Provided: Care of device  Saul Fordyce 04/03/2019, 3:55 PM

## 2019-04-03 NOTE — Progress Notes (Signed)
Orthopedic Tech Progress Note Patient Details:  Susan Wilkinson 14-Dec-1995 [id number]  CPM Right Knee CPM Right Knee: On Right Knee Flexion (Degrees): 35 Right Knee Extension (Degrees): 0  Post Interventions Patient Tolerated: Refused intervention Instructions Provided: Care of device  Saul Fordyce 04/03/2019, 2:37 PM
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
## 2019-04-04 LAB — PREPARE RBC (CROSSMATCH)

## 2019-04-04 LAB — BASIC METABOLIC PANEL
Anion gap: 10 (ref 5–15)
BUN: <5 mg/dL — ABNORMAL LOW (ref 6–20)
CO2: 20 mmol/L — ABNORMAL LOW (ref 22–32)
Calcium: 8.7 mg/dL — ABNORMAL LOW (ref 8.9–10.3)
Chloride: 110 mmol/L (ref 98–111)
Creatinine, Ser: 0.64 mg/dL (ref 0.44–1.00)
GFR calc Af Amer: >60 mL/min (ref >60)
GFR calc non Af Amer: >60 mL/min (ref >60)
Glucose, Bld: 95 mg/dL (ref 70–99)
Potassium: 4 mmol/L (ref 3.5–5.1)
Sodium: 140 mmol/L (ref 135–145)

## 2019-04-04 LAB — CBC
HCT: 21.8 % — ABNORMAL LOW (ref 36.0–46.0)
Hemoglobin: 6.7 g/dL — CL (ref 12.0–15.0)
MCH: 25.8 pg — ABNORMAL LOW (ref 26.0–34.0)
MCHC: 30.7 g/dL (ref 30.0–36.0)
MCV: 83.8 fL (ref 80.0–100.0)
Platelets: 384 10*3/uL (ref 150–400)
RBC: 2.6 MIL/uL — ABNORMAL LOW (ref 3.87–5.11)
RDW: 17.6 % — ABNORMAL HIGH (ref 11.5–15.5)
WBC: 12 10*3/uL — ABNORMAL HIGH (ref 4.0–10.5)
nRBC: 0 % (ref 0.0–0.2)

## 2019-04-04 LAB — HEMOGLOBIN AND HEMATOCRIT, BLOOD
HCT: 26.3 % — ABNORMAL LOW (ref 36.0–46.0)
Hemoglobin: 8.3 g/dL — ABNORMAL LOW (ref 12.0–15.0)

## 2019-04-04 MED ORDER — OXYCODONE HCL 5 MG PO TABS: 5.0000 mg | ORAL_TABLET | ORAL | Status: DC | PRN

## 2019-04-04 MED ORDER — DIPHENHYDRAMINE HCL 25 MG PO CAPS
25.0000 mg | ORAL_CAPSULE | Freq: Once | ORAL | Status: AC
  Administered 2019-04-04: 10:00:00 25 mg via ORAL
  Filled 2019-04-04: qty 1

## 2019-04-04 MED ORDER — HYDROMORPHONE HCL 1 MG/ML IJ SOLN
1.0000 mg | INTRAMUSCULAR | Status: DC | PRN
  Administered 2019-04-04 – 2019-04-05 (×7): 1 mg via INTRAVENOUS
  Filled 2019-04-04 (×7): qty 1

## 2019-04-04 MED ORDER — OXYCODONE HCL 5 MG PO TABS
10.0000 mg | ORAL_TABLET | ORAL | Status: DC | PRN
  Administered 2019-04-04 – 2019-04-05 (×5): 15 mg via ORAL
  Filled 2019-04-04 (×5): qty 3

## 2019-04-04 MED ORDER — BUPRENORPHINE HCL-NALOXONE HCL 2-0.5 MG SL SUBL: 1.0000 | SUBLINGUAL_TABLET | SUBLINGUAL | Status: DC | PRN

## 2019-04-04 MED ORDER — BUPRENORPHINE HCL-NALOXONE HCL 8-2 MG SL SUBL: 1.0000 | SUBLINGUAL_TABLET | Freq: Two times a day (BID) | SUBLINGUAL | Status: DC

## 2019-04-04 MED ORDER — SODIUM CHLORIDE 0.9% IV SOLUTION: Freq: Once | INTRAVENOUS | Status: DC

## 2019-04-04 NOTE — Progress Notes (Signed)
Orthopedic Tech Progress Note Patient Details:  Susan Wilkinson 03-11-1995 [id number]  CPM Right Knee CPM Right Knee: Off Right Knee Flexion (Degrees): 35 Right Knee Extension (Degrees): 0 Additional Comments: Patient wasn't ready to go into the CPM  Post Interventions Patient Tolerated: Refused intervention Instructions Provided: Care of device  Saul Fordyce 04/04/2019, 9:13 AM

## 2019-04-04 NOTE — Progress Notes (Signed)
RN received call from blood bank this morning. Due to national shortages, patient will only receive 1/2 ordered units unless primary team calls. PA paged. Okay to infuse only the one patient.

## 2019-04-04 NOTE — Plan of Care (Signed)
  Problem: Safety: Goal: Ability to remain free from injury will improve Outcome: Progressing  Bed alarm on. Helmet on at all times when out of bed.  Problem: Skin Integrity: Goal: Risk for impaired skin integrity will decrease Outcome: Progressing  Assess wound throughout shift. Completed Braden Scores.  Problem: Pain Managment: Goal: General experience of comfort will improve Outcome: Not Progressing  Pt continues to report uncontrolled pain on current pain regimen. Original POC was to DC pain medication and switch to suboxone; however, unable to complete due to patient's reports of pain.
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
## 2019-04-04 NOTE — Progress Notes (Signed)
Orthopedic Tech Progress Note Patient Details:  Susan Wilkinson Jun 10, 1995 [id number]  CPM Right Knee CPM Right Knee: On Right Knee Flexion (Degrees): 35 Right Knee Extension (Degrees): 0 Additional Comments: Patient wasn't ready to go into the CPM  Post Interventions Patient Tolerated: Refused intervention Instructions Provided: Care of device  Saul Fordyce 04/04/2019, 12:20 PM

## 2019-04-04 NOTE — Progress Notes (Signed)
Patient hemoglobin 6.7 this morning.Call placed to Dr.Landau awaiting a response.

## 2019-04-04 NOTE — Progress Notes (Signed)
PT Cancellation Note  Patient Details Name: Susan Wilkinson MRN: [id number] DOB: December 14, 1995   Cancelled Treatment:    Reason Eval/Treat Not Completed: Medical issues which prohibited therapy(hgb 6.7)   [name] B [name] 04/04/2019, 7:31 AM Merryl Hacker, PT Acute Rehabilitation Services Pager: [phone] Office: [phone]

## 2019-04-04 NOTE — Progress Notes (Signed)
RN called to check status of 1450 H&H. Per phlebotomist will come to bedside shortly to collect sample.

## 2019-04-05 LAB — CBC
HCT: 26.9 % — ABNORMAL LOW (ref 36.0–46.0)
Hemoglobin: 8.4 g/dL — ABNORMAL LOW (ref 12.0–15.0)
MCH: 26.3 pg (ref 26.0–34.0)
MCHC: 31.2 g/dL (ref 30.0–36.0)
MCV: 84.1 fL (ref 80.0–100.0)
Platelets: 427 10*3/uL — ABNORMAL HIGH (ref 150–400)
RBC: 3.2 MIL/uL — ABNORMAL LOW (ref 3.87–5.11)
RDW: 17.6 % — ABNORMAL HIGH (ref 11.5–15.5)
WBC: 12.8 10*3/uL — ABNORMAL HIGH (ref 4.0–10.5)
nRBC: 0 % (ref 0.0–0.2)

## 2019-04-05 LAB — BASIC METABOLIC PANEL
Anion gap: 10 (ref 5–15)
BUN: 7 mg/dL (ref 6–20)
CO2: 21 mmol/L — ABNORMAL LOW (ref 22–32)
Calcium: 8.9 mg/dL (ref 8.9–10.3)
Chloride: 107 mmol/L (ref 98–111)
Creatinine, Ser: 0.7 mg/dL (ref 0.44–1.00)
GFR calc Af Amer: >60 mL/min (ref >60)
GFR calc non Af Amer: >60 mL/min (ref >60)
Glucose, Bld: 92 mg/dL (ref 70–99)
Potassium: 4.1 mmol/L (ref 3.5–5.1)
Sodium: 138 mmol/L (ref 135–145)

## 2019-04-05 LAB — TYPE AND SCREEN
ABO/RH(D): O POS
Antibody Screen: NEGATIVE
Unit division: 0

## 2019-04-05 LAB — BPAM RBC
Blood Product Expiration Date: 202104132359
ISSUE DATE / TIME: 202103121040
Unit Type and Rh: 5100

## 2019-04-05 MED ORDER — ENOXAPARIN SODIUM 40 MG/0.4ML ~~LOC~~ SOLN: 40.0000 mg | SUBCUTANEOUS | 0 refills | Status: AC

## 2019-04-05 MED ORDER — ENOXAPARIN SODIUM 40 MG/0.4ML ~~LOC~~ SOLN
40.0000 mg | SUBCUTANEOUS | Status: DC
  Administered 2019-04-05: 40 mg via SUBCUTANEOUS
  Filled 2019-04-05: qty 0.4

## 2019-04-05 NOTE — Discharge Summary (Signed)
PATIENT ID: Susan Wilkinson        MRN:  [id number]          DOB/AGE: Feb 02, 1995 / 24 y.o.    DISCHARGE SUMMARY  ADMISSION DATE:    04/02/2019 DISCHARGE DATE:   04/05/2019   ADMISSION DIAGNOSIS: Closed fracture of distal end of right femur with nonunion [S72.401K]    DISCHARGE DIAGNOSIS:  Right femur nonunion    ADDITIONAL DIAGNOSIS: Principal Problem:   Open fracture of right femur, type IIIA, IIIB, or IIIC (HCC) Active Problems:   Closed fracture of distal end of right femur with nonunion  Past Medical History:  Diagnosis Date  . Medical history non-contributory   . Seizure (HCC) 01/2019   Only seizure None since on keppra    PROCEDURE: Procedure(s): REPAIR RIGHT FEMORAL NONUNION WITH LEFT FEMUR RETROGRADE RIA Right on 04/02/2019  CONSULTS:   PT/OT  HISTORY:  See H&P in chart  HOSPITAL COURSE:  Susan Wilkinson is a 24 y.o. admitted on 04/02/2019 and found to have a diagnosis of Right femur nonunion.  After appropriate laboratory studies were obtained  they were taken to the operating room on 04/02/2019 and underwent  Procedure(s): REPAIR RIGHT FEMORAL NONUNION WITH LEFT FEMUR RETROGRADE RIA  Right.   They were given perioperative antibiotics:  Anti-infectives (From admission, onward)   Start     Dose/Rate Route Frequency Ordered Stop   04/02/19 2030  ceFAZolin (ANCEF) IVPB 2g/100 mL premix     2 g 200 mL/hr over 30 Minutes Intravenous Every 6 hours 04/02/19 1847 04/03/19 0847   04/02/19 1100  ceFAZolin (ANCEF) IVPB 2g/100 mL premix     2 g 200 mL/hr over 30 Minutes Intravenous On call to O.R. 04/02/19 1046 04/02/19 1435    .  Tolerated the procedure well.    POD #1, allowed out of bed to a chair.  PT for ambulation and exercise program.  IV saline locked.  O2 discontionued.  POD #2, ABLA transfused 2 units, trial with suboxone pt did not tolerate   POD#3, ABLA stable, lovenox given, pt desires to d/c home with her mother today pain is under control  The remainder of the  hospital course was dedicated to ambulation and strengthening.   The patient was discharged on 3 Days Post-Op in  Stable condition.  Blood products given:2 units CC PRBC  DIAGNOSTIC STUDIES: Recent vital signs:  Patient Vitals for the past 24 hrs:  BP Temp Temp src Pulse Resp SpO2  04/05/19 0519 123/77 98.4 F (36.9 C) Oral 71 18 100 %  04/04/19 2116 110/74 98.8 F (37.1 C) Oral 87 17 100 %  04/04/19 1420 111/75 98.9 F (37.2 C) Oral 78 18 100 %  04/04/19 1250 113/78 98.7 F (37.1 C) Oral 80 16 100 %  04/04/19 1113 108/77 (!) 97.4 F (36.3 C) Oral 88 18 100 %  04/04/19 1056 113/86 98.2 F (36.8 C) Oral 91 16 99 %       Recent laboratory studies: Recent Labs    04/02/19 1145 04/03/19 0252 04/04/19 0349 04/04/19 1545 04/05/19 0239  WBC 13.3* 14.8* 12.0*  --  12.8*  HGB 9.4* 7.6* 6.7* 8.3* 8.4*  HCT 31.8* 24.3* 21.8* 26.3* 26.9*  PLT 459* 454* 384  --  427*   Recent Labs    04/02/19 1145 04/03/19 0252 04/04/19 0241 04/05/19 0239  NA 136 135 140 138  K 4.3 4.2 4.0 4.1  CL 102 104 110 107  CO2 22 20* 20* 21*  BUN  8 6 <5* 7  CREATININE 0.58 0.64 0.64 0.70  GLUCOSE 91 194* 95 92  CALCIUM 9.5 9.1 8.7* 8.9   Lab Results  Component Value Date   INR 1.2 01/29/2019     Recent Radiographic Studies :  DG C-Arm 1-60 Min  Result Date: 04/02/2019 CLINICAL DATA:  24 year old female with distal right femur nonunion. EXAM: DG C-ARM 1-60 MIN; RIGHT FEMUR 2 VIEWS FLUOROSCOPY TIME:  Fluoroscopy Time:  1 minutes 32 seconds Radiation Exposure Index (if provided by the fluoroscopic device): Number of Acquired Spot Images: 0 COMPARISON:  Right femur series 02/14/2019. FINDINGS: Three intraoperative fluoroscopic spot views of the distal right femur redemonstrate the lateral plate and multiple screw fixation from the shaft to the condyles with intervening metadiaphysis bone cement. Curettage of the bone cement is demonstrated on the 2nd image. The final image shows new cement or bone  graft placement in the defect. IMPRESSION: Revision of the distal right femur at the area of metadiaphysis nonunion. Electronically Signed   By: Genevie Ann M.D.   On: 04/02/2019 19:18   DG FEMUR, MIN 2 VIEWS RIGHT  Result Date: 04/02/2019 CLINICAL DATA:  24 year old female with distal right femur nonunion. EXAM: DG C-ARM 1-60 MIN; RIGHT FEMUR 2 VIEWS FLUOROSCOPY TIME:  Fluoroscopy Time:  1 minutes 32 seconds Radiation Exposure Index (if provided by the fluoroscopic device): Number of Acquired Spot Images: 0 COMPARISON:  Right femur series 02/14/2019. FINDINGS: Three intraoperative fluoroscopic spot views of the distal right femur redemonstrate the lateral plate and multiple screw fixation from the shaft to the condyles with intervening metadiaphysis bone cement. Curettage of the bone cement is demonstrated on the 2nd image. The final image shows new cement or bone graft placement in the defect. IMPRESSION: Revision of the distal right femur at the area of metadiaphysis nonunion. Electronically Signed   By: Genevie Ann M.D.   On: 04/02/2019 19:18   DG FEMUR PORT, MIN 2 VIEWS RIGHT  Result Date: 04/02/2019 CLINICAL DATA:  24 year old female with distal right femur nonunion. EXAM: RIGHT FEMUR PORTABLE 2 VIEW COMPARISON:  Intraoperative images today. Right femur series 02/14/2019. FINDINGS: Right femoral head remains normally located. The proximal femoral shaft hardware is stable. As before the hardware extends to the femoral condyles where multiple cortical screws appear stable. All hardware appears intact. Revision of the metadiaphysis region as seen intraoperatively today with new bone graft or similar material replacing the previous bone cement. IMPRESSION: Revision of the distal right femur metadiaphysis area of nonunion. Hardware appears stable since January and intact. Electronically Signed   By: Genevie Ann M.D.   On: 04/02/2019 19:20    DISCHARGE INSTRUCTIONS:   DISCHARGE MEDICATIONS:   lovenox 40mg  inj  daily x30 days   FOLLOW UP VISIT:   Follow-up Information    Haddix, Thomasene Lot, MD. Call in 2 week(s).   Specialty: Orthopedic Surgery Why: make appt to be seen in 2 weeks Contact information: [place] [id number] [phone]           DISPOSITION:   Home  CONDITION:  Stable   Chriss Czar, PA-C  04/05/2019 8:53 AM
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
## 2019-04-05 NOTE — Discharge Instructions (Signed)
Diet: As you were doing prior to hospitalization   Activity: Increase activity slowly as tolerated  No lifting or driving for 6 weeks   Shower: May shower without a dressing on post op day #5, NO SOAKING in tub   Dressing: You may leave incision open to air.  Weight Bearing: nonweight bearing right leg, weight bearing as tolerated left leg as taught in physical therapy. Use a walker or  Crutches as instructed.   To prevent constipation: you may use a stool softener such as -  Colace ( over the counter) 100 mg by mouth twice a day  Drink plenty of fluids ( prune juice may be helpful) and high fiber foods  Miralax ( over the counter) for constipation as needed.   Precautions: If you experience chest pain or shortness of breath - call 911 immediately For transfer to the hospital emergency department!!  If you develop a fever greater that 101 F, purulent drainage from wound, increased redness or drainage from wound, or calf pain -- Call the office   Follow- Up Appointment: Please call for an appointment to be seen in 2 weeks
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
## 2019-04-10 ENCOUNTER — Ambulatory Visit (HOSPITAL_COMMUNITY)
Admission: RE | Admit: 2019-04-10 | Discharge: 2019-04-10 | Disposition: A | Discharge status: Home / Self Care | Payer: No Typology Code available for payment source | Source: Ambulatory Visit | Attending: Neurosurgery | Admitting: Neurosurgery

## 2019-04-10 ENCOUNTER — Other Ambulatory Visit: Payer: Self-pay

## 2019-04-10 DIAGNOSIS — M952 Other acquired deformity of head: Secondary | ICD-10-CM

## 2019-04-17 ENCOUNTER — Encounter: Payer: Self-pay | Attending: Physical Medicine & Rehabilitation | Admitting: Physical Medicine and Rehabilitation

## 2019-04-17 DIAGNOSIS — S7291XF Unspecified fracture of right femur, subsequent encounter for open fracture type IIIA, IIIB, or IIIC with routine healing: Secondary | ICD-10-CM | POA: Insufficient documentation

## 2019-04-17 DIAGNOSIS — F1910 Other psychoactive substance abuse, uncomplicated: Secondary | ICD-10-CM | POA: Insufficient documentation

## 2019-04-17 DIAGNOSIS — R300 Dysuria: Secondary | ICD-10-CM | POA: Insufficient documentation

## 2019-04-17 DIAGNOSIS — S069X3S Unspecified intracranial injury with loss of consciousness of 1 hour to 5 hours 59 minutes, sequela: Secondary | ICD-10-CM | POA: Insufficient documentation

## 2019-04-17 DIAGNOSIS — G8918 Other acute postprocedural pain: Secondary | ICD-10-CM | POA: Insufficient documentation

## 2019-05-28 ENCOUNTER — Other Ambulatory Visit: Payer: Self-pay | Admitting: Neurosurgery

## 2019-06-17 ENCOUNTER — Inpatient Hospital Stay: Admit: 2019-06-17 | Payer: No Typology Code available for payment source

## 2019-06-17 SURGERY — CRANIOPLASTY
Anesthesia: General | Laterality: Left

## 2020-10-20 IMAGING — DX DG HUMERUS 2V *L*
2 series · 2 of 2 positions shown · non-contrast
Comparison: Radiographs 01/29/2019

CLINICAL DATA: Internal fixation of proximal humeral fractures.

EXAM:
LEFT HUMERUS - 2+ VIEW

[humerus ap (1 of 2)]
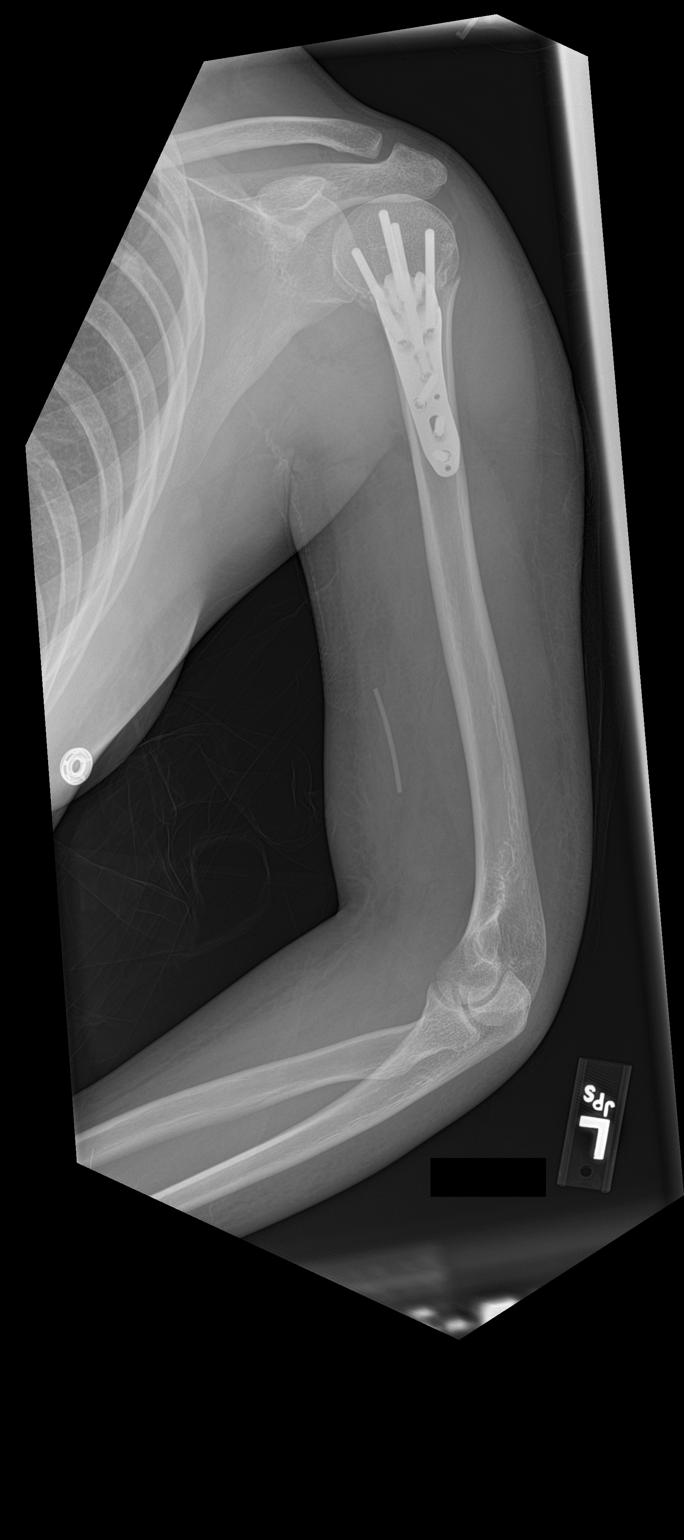

[humerus ap (2 of 2)]
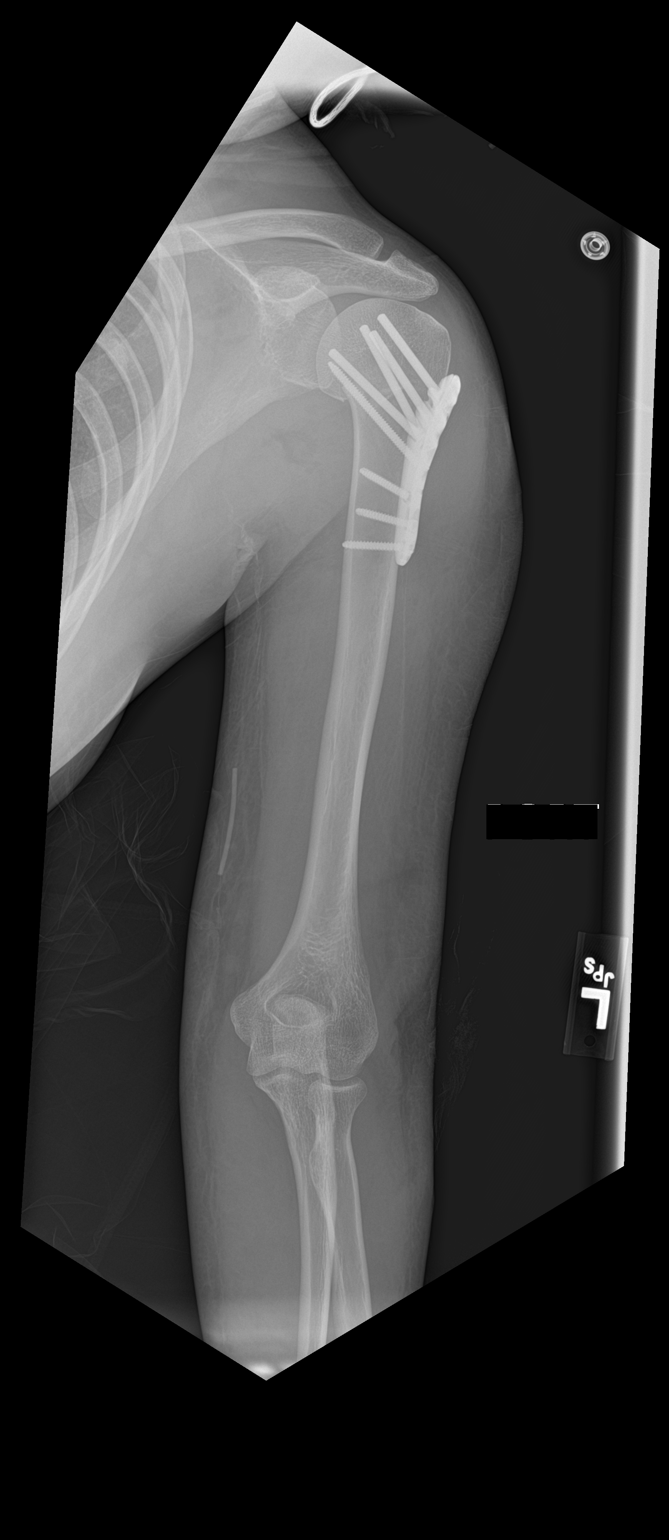

[2 of 2 positions shown; findings below may reference images not displayed]

FINDINGS: There is a lateral sideplate and multiple screws transfixing the
humeral neck fracture. Anatomic alignment without complicating
features.

Linear foreign body noted in the medial soft tissues of the mid arm,
possibly on the patient's skin. Recommend clinical correlation
IMPRESSION: Internal fixation of humeral neck fracture with anatomic alignment.

## 2020-10-20 IMAGING — DX DG FEMUR 2+V PORT*R*
4 series · 4 of 4 positions shown · non-contrast
Comparison: 01/29/2019

CLINICAL DATA: Status post ORIF of right distal femur fracture.

EXAM:
RIGHT FEMUR PORTABLE 2 VIEW

[knee ap (1 of 3)]
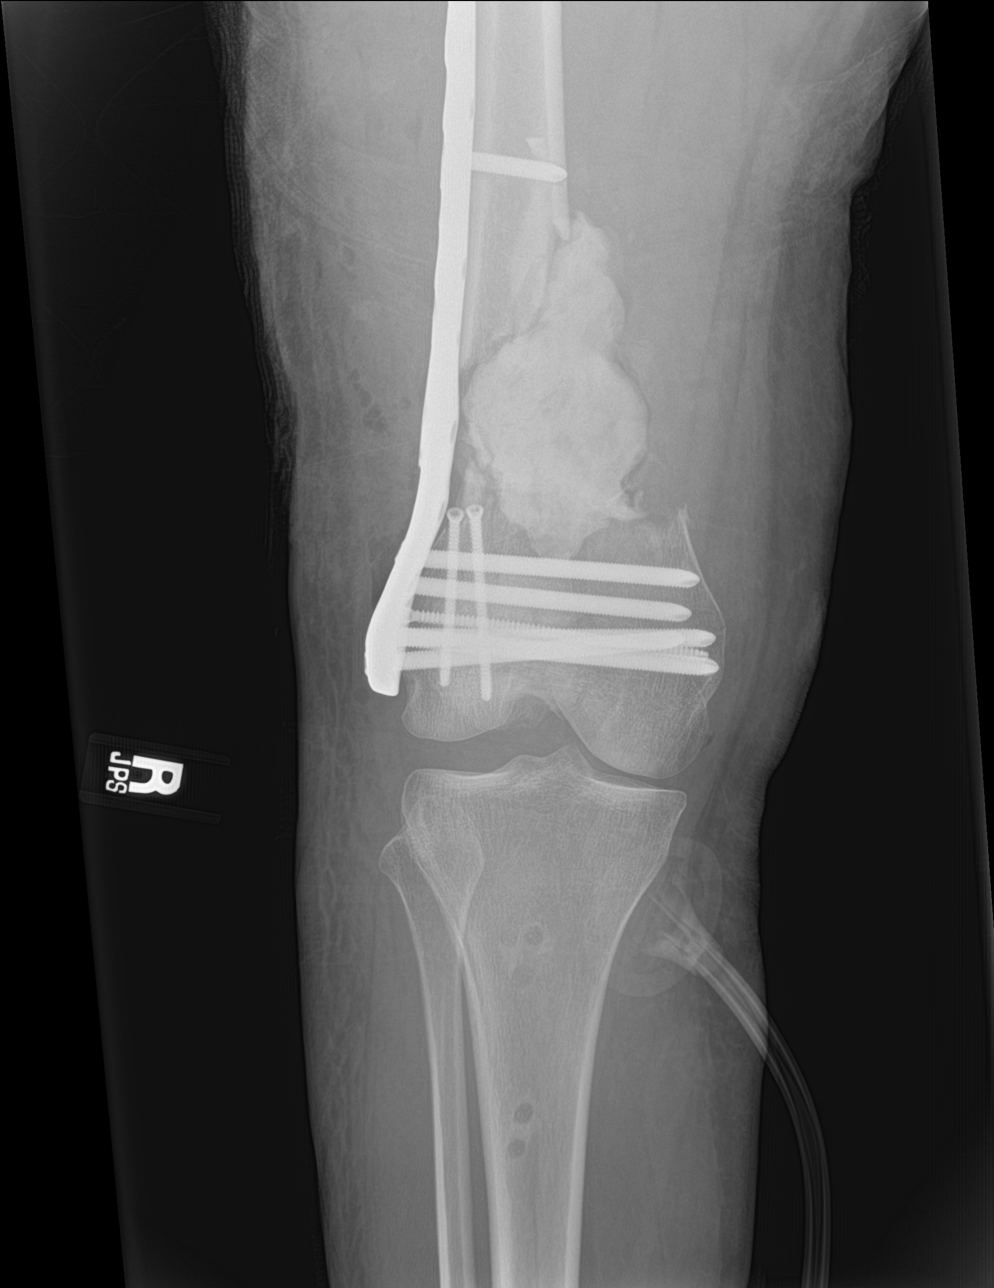

[knee ap (2 of 3)]
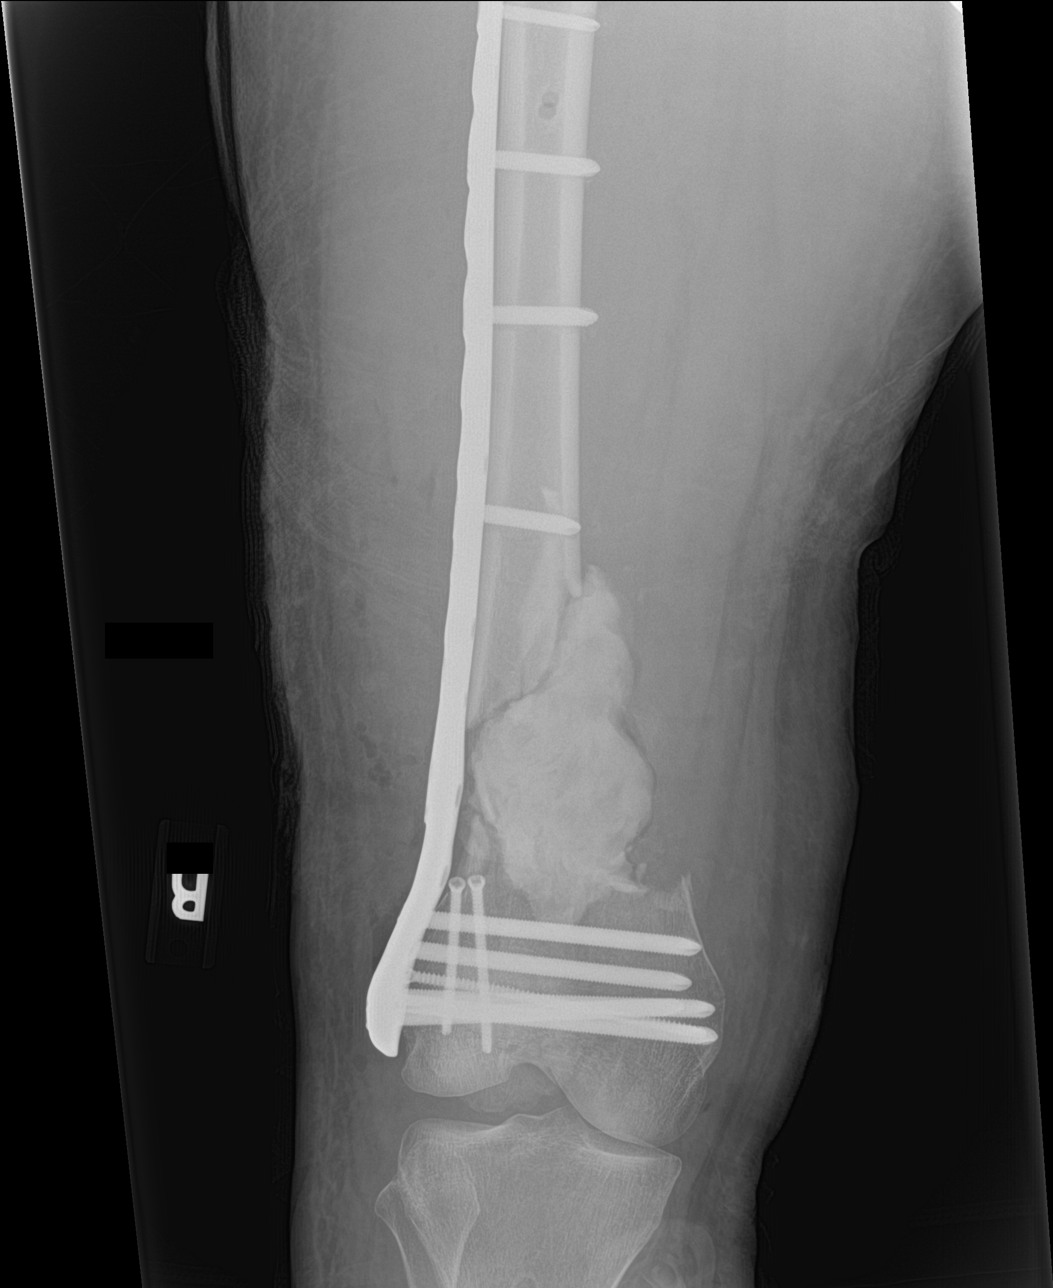

[knee ap (3 of 3)]
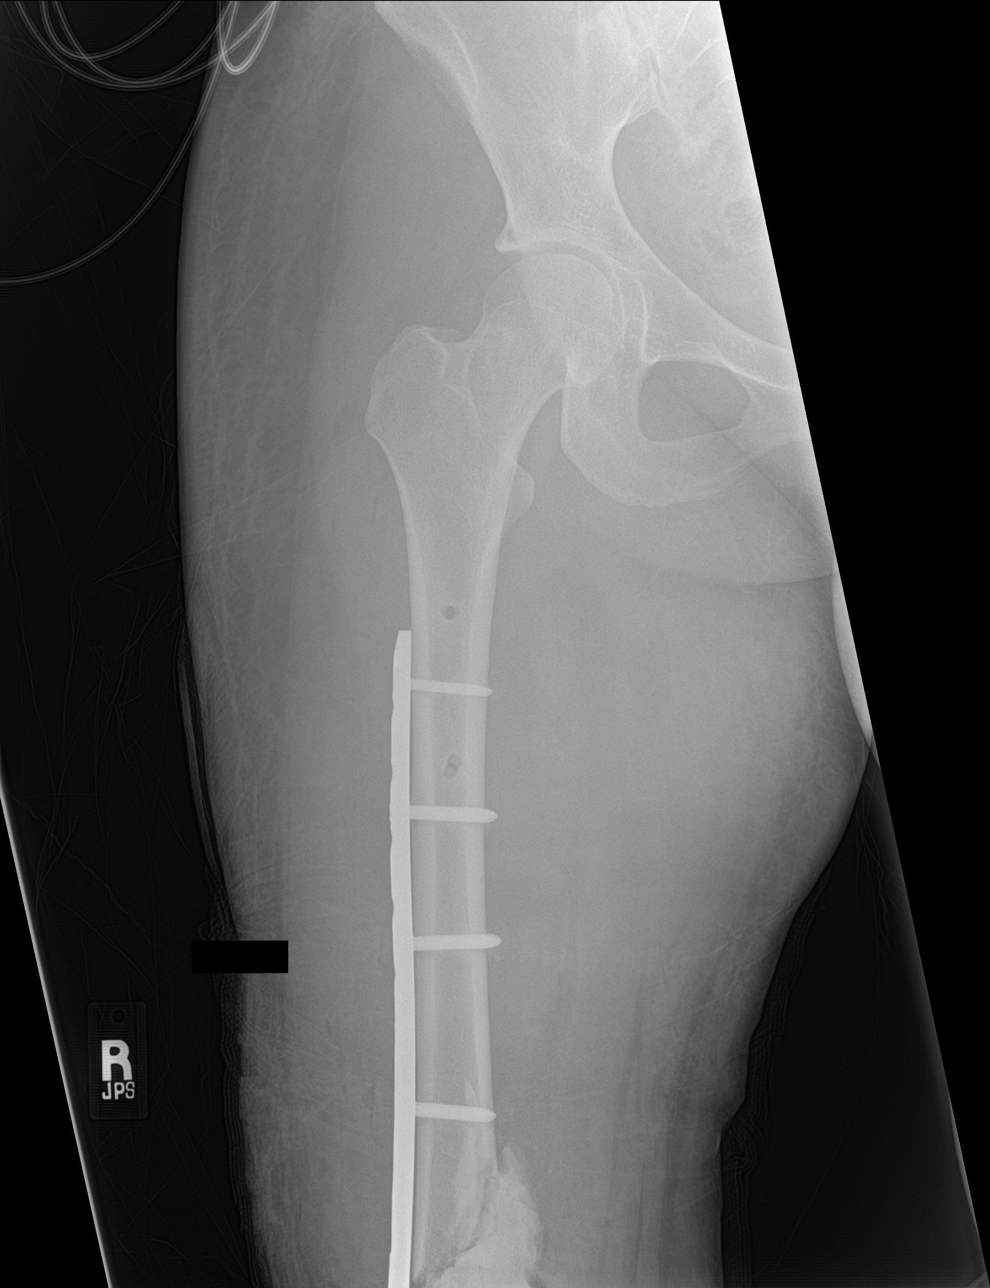

[knee lat]
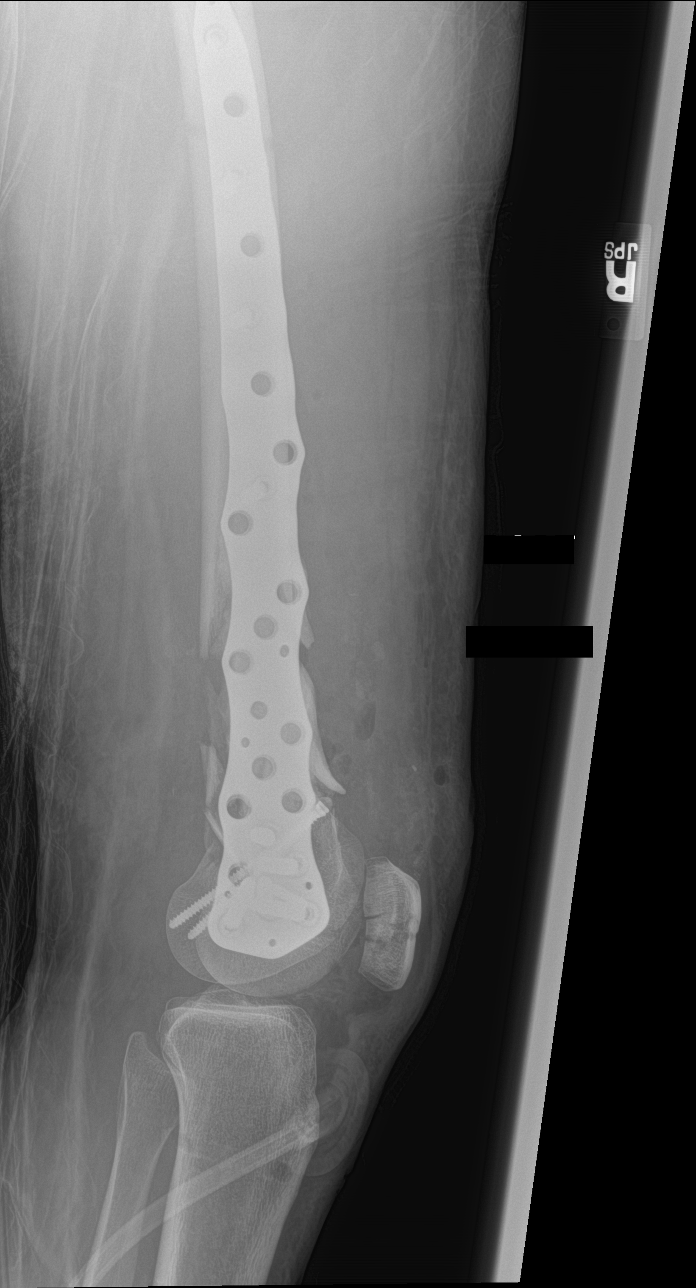

[4 of 4 positions shown; findings below may reference images not displayed]

FINDINGS: There is a long lateral femoral sideplate and multiple compression
screws transfixing the complex comminuted distal femur fracture.
Multiple distal screws are noted. The antibiotic beads have been
removed and there is a large area of bone cement in the fracture
defect.

The right hip appears normal. Prior external fixator screw holes are
noted in the femur and proximal tibia.

Nondisplaced patellar fractures are noted.
IMPRESSION: Open reduction and internal fixation of complex comminuted distal
femur fractures with good position and alignment and large focus of
bone cement in the fracture defect.

Stable nondisplaced patellar fractures.

## 2020-10-20 IMAGING — CT CT HEAD W/O CM
3 series · 15 of 47 positions shown, 18 images · non-contrast
Comparison: 01/30/2019

CLINICAL DATA: Seizure. Previous craniectomy for head trauma and
brain swelling.

EXAM:
CT HEAD WITHOUT CONTRAST
TECHNIQUE: Contiguous axial images were obtained from the base of the skull
through the vertex without intravenous contrast.

[Series 2: head 5.0 h30s · axial · 0.44mm/px · z∈[-109,+36]mm · 9 of 35 slices shown, 12 images]
[im 3/35  brain]
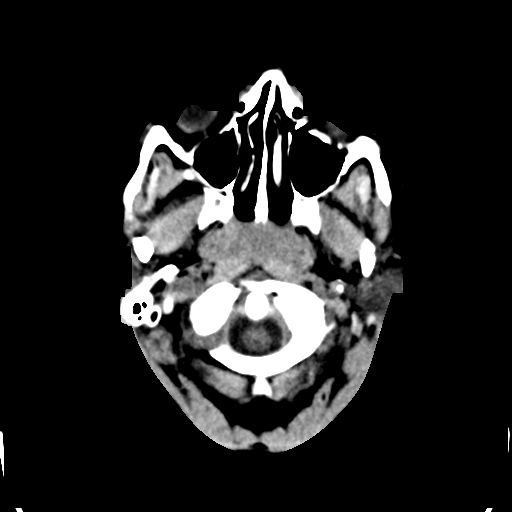
[im 3/35  bone]
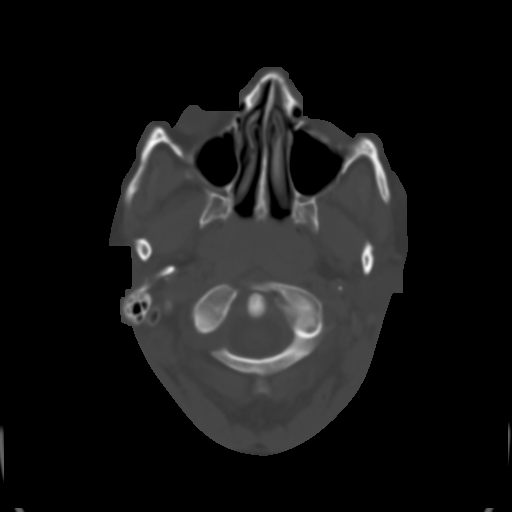
[im 6/35  brain]
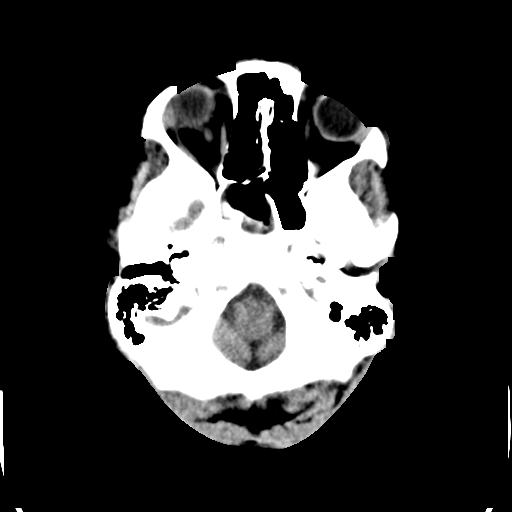
[im 10/35  brain]
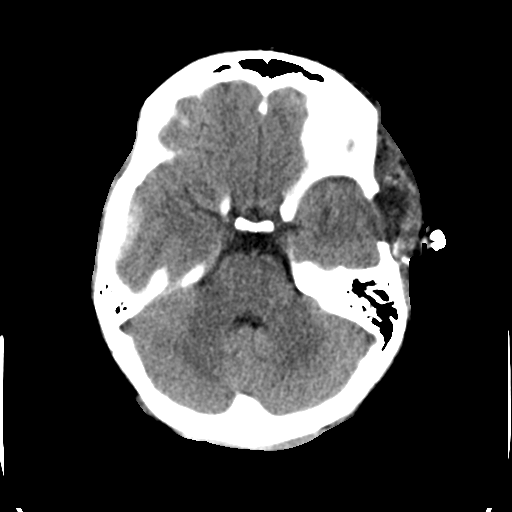
[im 13/35  brain]
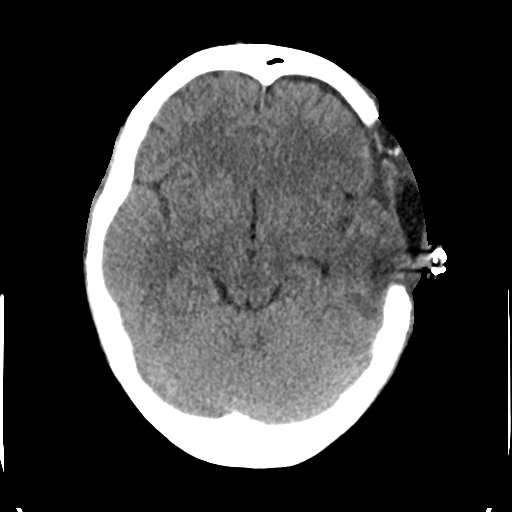
[im 18/35  brain]
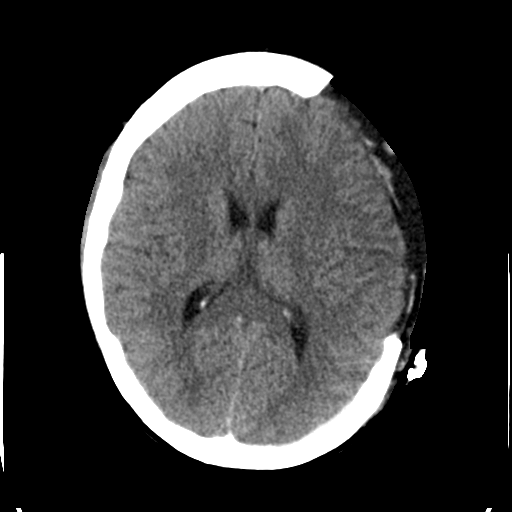
[im 18/35  bone]
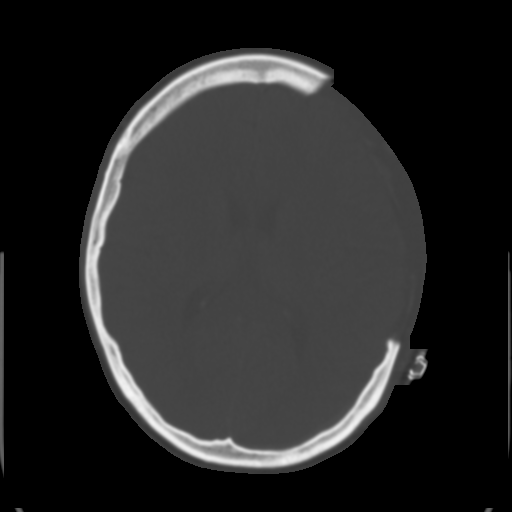
[im 22/35  brain]
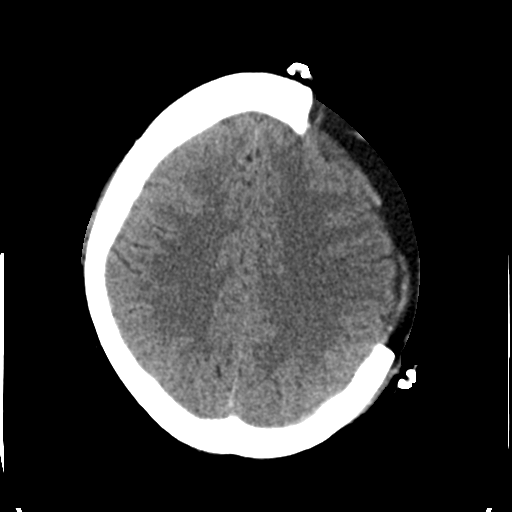
[im 25/35  brain]
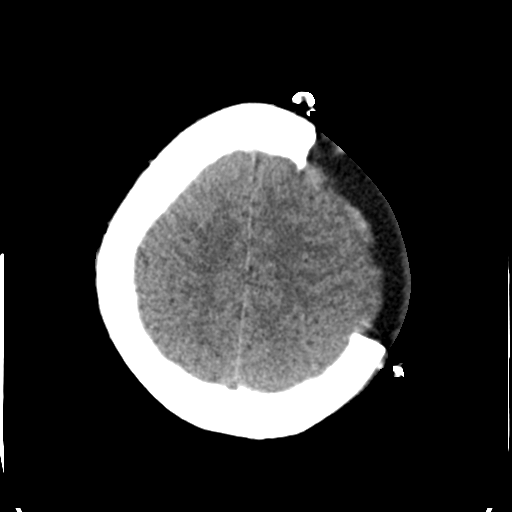
[im 29/35  brain]
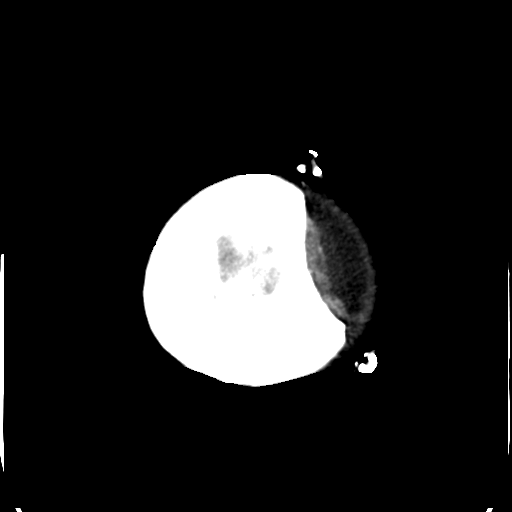
[im 32/35  brain]
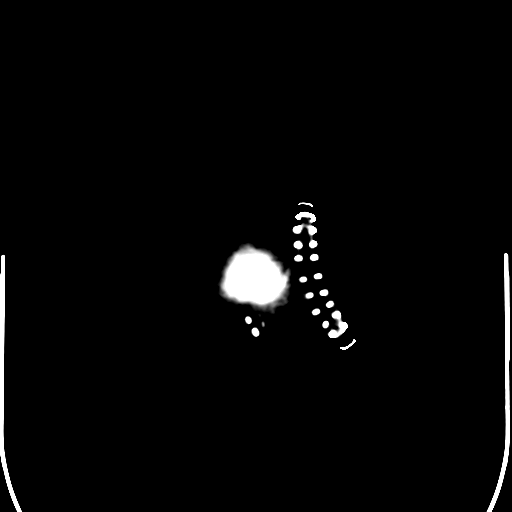
[im 32/35  bone]
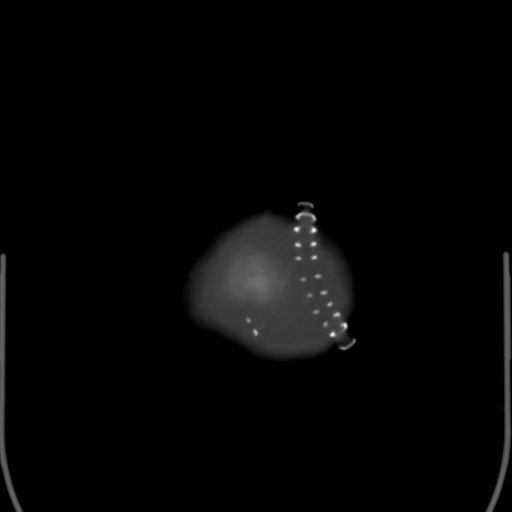

[Series 4: head 3.0 mpr cor · coronal · 0.34mm/px · 3 of 72 slices shown]
[im 24/72  brain]
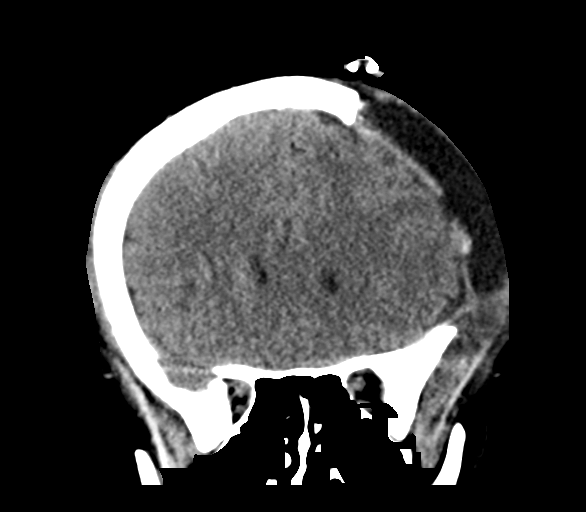
[im 32/72  brain]
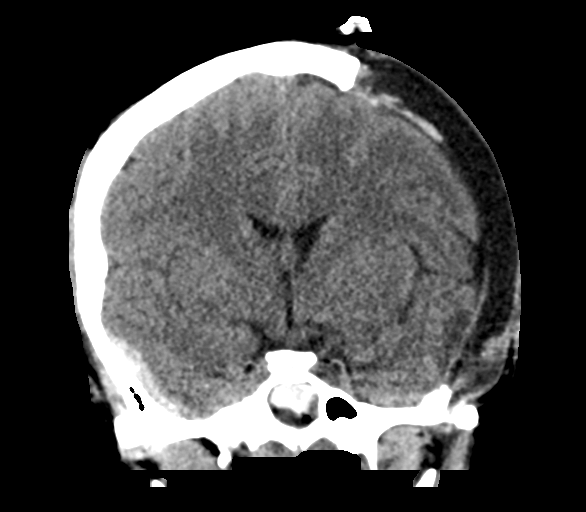
[im 40/72  brain]
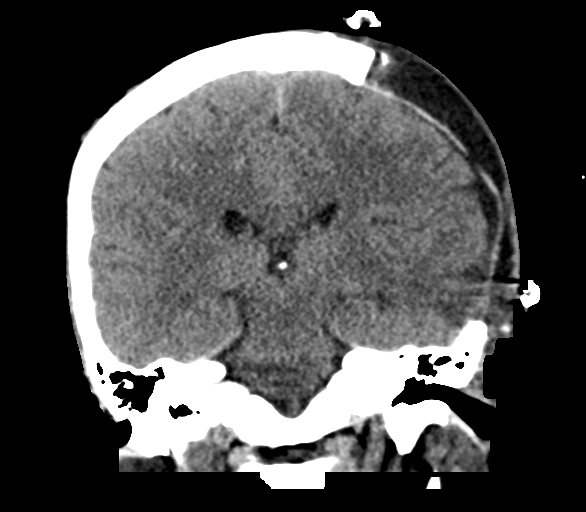

[Series 5: head 3.0 mpr sag · sagittal · 0.33mm/px · 3 of 67 slices shown]
[im 23/67  brain]
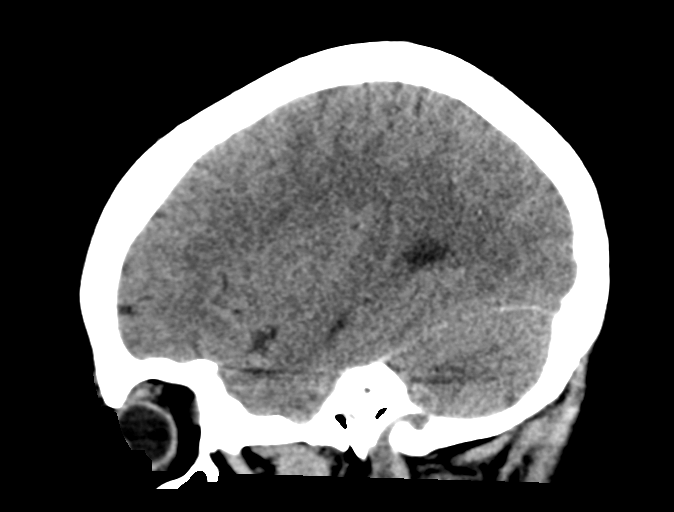
[im 34/67  brain]
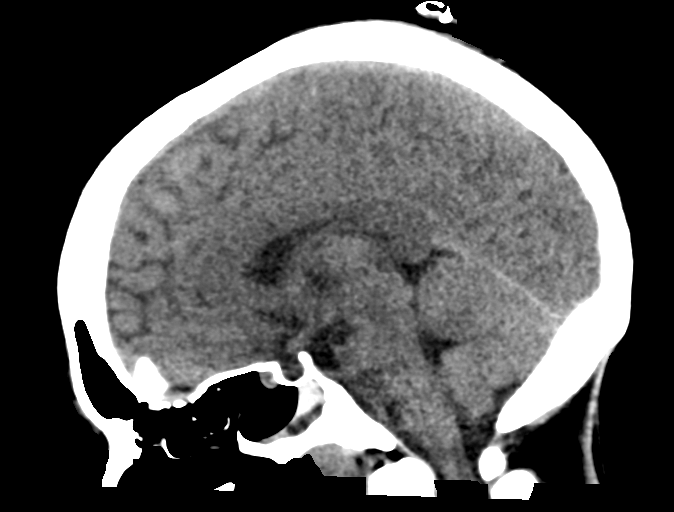
[im 45/67  brain]
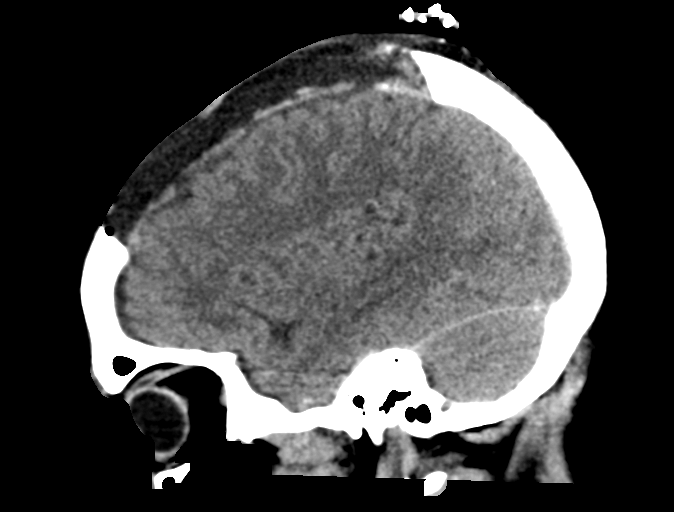

[15 of 47 positions shown; findings below may reference images not displayed]

FINDINGS: Brain: Previous left craniectomy. This allows good decompression as
there is no midline shift. Mild swelling of the underlying left
frontal and temporal brain. No evidence of intraparenchymal
hemorrhage. No hydrocephalus. Extra-axial hematoma in the lateral
aspect of the middle cranial fossa on the right has not enlarged,
maximal thickness 5 mm.

Vascular: No primary vascular finding.

Skull: No unexpected finding.

Sinuses/Orbits: Clear

Other: None
IMPRESSION: Large left craniectomy for decompression. Good decompression with no
midline shift. Mild swelling and edema of the left frontal and
temporal brain. No sign of intraparenchymal hemorrhage.

No enlargement of an extra-axial hematoma in the lateral aspect of
the middle cranial fossa on the right, maximal thickness 5 mm.
